# Patient Record
Sex: Female | Born: 1945 | Race: White | Hispanic: No | Marital: Married | Smoking: Never smoker
Health system: Southern US, Community
[De-identification: ages and names within clinical notes are randomized; demographics above are authoritative.]

## PROBLEM LIST (undated history)

## (undated) DIAGNOSIS — I10 Essential (primary) hypertension: Secondary | ICD-10-CM

## (undated) DIAGNOSIS — E785 Hyperlipidemia, unspecified: Secondary | ICD-10-CM

## (undated) DIAGNOSIS — R011 Cardiac murmur, unspecified: Secondary | ICD-10-CM

## (undated) HISTORY — PX: TONSILLECTOMY: SUR1361

## (undated) HISTORY — DX: Hyperlipidemia, unspecified: E78.5

## (undated) HISTORY — DX: Cardiac murmur, unspecified: R01.1

## (undated) HISTORY — DX: Essential (primary) hypertension: I10

---

## 1999-01-05 ENCOUNTER — Ambulatory Visit (HOSPITAL_COMMUNITY): Admission: RE | Admit: 1999-01-05 | Discharge: 1999-01-05 | Payer: Self-pay | Admitting: Neurological Surgery

## 1999-01-05 ENCOUNTER — Encounter: Payer: Self-pay | Admitting: Neurological Surgery

## 1999-06-11 ENCOUNTER — Inpatient Hospital Stay (HOSPITAL_COMMUNITY): Admission: EM | Admit: 1999-06-11 | Discharge: 1999-06-13 | Payer: Self-pay | Admitting: Emergency Medicine

## 1999-06-11 ENCOUNTER — Encounter: Payer: Self-pay | Admitting: Surgery

## 1999-06-11 ENCOUNTER — Encounter: Payer: Self-pay | Admitting: *Deleted

## 1999-07-11 ENCOUNTER — Other Ambulatory Visit: Admission: RE | Admit: 1999-07-11 | Discharge: 1999-07-11 | Payer: Self-pay | Admitting: Obstetrics & Gynecology

## 2000-11-03 ENCOUNTER — Other Ambulatory Visit: Admission: RE | Admit: 2000-11-03 | Discharge: 2000-11-03 | Payer: Self-pay | Admitting: Obstetrics & Gynecology

## 2001-11-24 ENCOUNTER — Other Ambulatory Visit: Admission: RE | Admit: 2001-11-24 | Discharge: 2001-11-24 | Payer: Self-pay | Admitting: Obstetrics & Gynecology

## 2002-12-30 ENCOUNTER — Other Ambulatory Visit: Admission: RE | Admit: 2002-12-30 | Discharge: 2002-12-30 | Payer: Self-pay | Admitting: Obstetrics and Gynecology

## 2004-02-15 ENCOUNTER — Other Ambulatory Visit: Admission: RE | Admit: 2004-02-15 | Discharge: 2004-02-15 | Payer: Self-pay | Admitting: Obstetrics & Gynecology

## 2004-09-04 ENCOUNTER — Ambulatory Visit: Payer: Self-pay | Admitting: Cardiology

## 2004-12-25 ENCOUNTER — Ambulatory Visit: Payer: Self-pay | Admitting: *Deleted

## 2005-04-05 ENCOUNTER — Ambulatory Visit: Payer: Self-pay | Admitting: Cardiology

## 2005-04-15 ENCOUNTER — Other Ambulatory Visit: Admission: RE | Admit: 2005-04-15 | Discharge: 2005-04-15 | Payer: Self-pay | Admitting: Obstetrics & Gynecology

## 2005-04-22 ENCOUNTER — Ambulatory Visit: Payer: Self-pay | Admitting: *Deleted

## 2005-04-25 ENCOUNTER — Ambulatory Visit: Payer: Self-pay | Admitting: Cardiology

## 2005-08-12 ENCOUNTER — Ambulatory Visit: Payer: Self-pay | Admitting: *Deleted

## 2005-08-13 ENCOUNTER — Ambulatory Visit: Payer: Self-pay | Admitting: Cardiovascular Disease

## 2006-05-20 ENCOUNTER — Ambulatory Visit: Payer: Self-pay | Admitting: *Deleted

## 2006-06-04 ENCOUNTER — Ambulatory Visit: Payer: Self-pay

## 2006-06-04 ENCOUNTER — Ambulatory Visit: Payer: Self-pay | Admitting: *Deleted

## 2007-08-03 ENCOUNTER — Ambulatory Visit: Payer: Self-pay | Admitting: Cardiology

## 2007-08-03 LAB — CONVERTED CEMR LAB
Albumin: 3.7 g/dL (ref 3.5–5.2)
Alkaline Phosphatase: 46 units/L (ref 39–117)
Direct LDL: 150.8 mg/dL
HDL: 54.6 mg/dL (ref >39.0)
Total Bilirubin: 0.6 mg/dL (ref 0.3–1.2)
Total CHOL/HDL Ratio: 4
Total Protein: 6.6 g/dL (ref 6.0–8.3)
Triglycerides: 158 mg/dL — ABNORMAL HIGH (ref 0–149)

## 2007-08-04 ENCOUNTER — Ambulatory Visit: Payer: Self-pay | Admitting: Cardiology

## 2007-08-31 ENCOUNTER — Ambulatory Visit: Payer: Self-pay

## 2008-08-01 ENCOUNTER — Ambulatory Visit: Payer: Self-pay | Admitting: Cardiology

## 2008-08-01 LAB — CONVERTED CEMR LAB
ALT: 17 units/L (ref 0–35)
AST: 16 units/L (ref 0–37)
Albumin: 3.8 g/dL (ref 3.5–5.2)
Alkaline Phosphatase: 40 units/L (ref 39–117)
BUN: 8 mg/dL (ref 6–23)
Bilirubin, Direct: 0.1 mg/dL (ref 0.0–0.3)
CO2: 28 meq/L (ref 19–32)
Calcium: 8.8 mg/dL (ref 8.4–10.5)
Chloride: 103 meq/L (ref 96–112)
Cholesterol: 234 mg/dL (ref 0–200)
Creatinine, Ser: 0.7 mg/dL (ref 0.4–1.2)
Direct LDL: 164 mg/dL
GFR calc Af Amer: 109 mL/min
GFR calc non Af Amer: 90 mL/min
Glucose, Bld: 96 mg/dL (ref 70–99)
HDL: 62.9 mg/dL (ref >39.0)
Potassium: 3.6 meq/L (ref 3.5–5.1)
Sodium: 140 meq/L (ref 135–145)
Total Bilirubin: 0.6 mg/dL (ref 0.3–1.2)
Total CHOL/HDL Ratio: 3.7
Total Protein: 6.6 g/dL (ref 6.0–8.3)
Triglycerides: 177 mg/dL — ABNORMAL HIGH (ref 0–149)
VLDL: 35 mg/dL (ref 0–40)

## 2008-09-02 ENCOUNTER — Encounter: Admission: RE | Admit: 2008-09-02 | Discharge: 2008-09-02 | Payer: Self-pay | Admitting: Obstetrics & Gynecology

## 2008-11-29 ENCOUNTER — Encounter: Admission: RE | Admit: 2008-11-29 | Discharge: 2008-11-29 | Payer: Self-pay | Admitting: Neurological Surgery

## 2009-07-09 DIAGNOSIS — I10 Essential (primary) hypertension: Secondary | ICD-10-CM | POA: Insufficient documentation

## 2009-07-09 DIAGNOSIS — E785 Hyperlipidemia, unspecified: Secondary | ICD-10-CM

## 2009-07-14 ENCOUNTER — Ambulatory Visit: Payer: Self-pay | Admitting: Cardiology

## 2009-07-20 ENCOUNTER — Ambulatory Visit: Payer: Self-pay | Admitting: Cardiology

## 2009-07-21 LAB — CONVERTED CEMR LAB
ALT: 14 units/L (ref 0–35)
AST: 21 units/L (ref 0–37)
Albumin: 4 g/dL (ref 3.5–5.2)
Alkaline Phosphatase: 37 units/L — ABNORMAL LOW (ref 39–117)
Calcium: 8.9 mg/dL (ref 8.4–10.5)
Cholesterol: 188 mg/dL (ref 0–200)
Creatinine, Ser: 0.7 mg/dL (ref 0.4–1.2)
GFR calc non Af Amer: 89.51 mL/min (ref >60)
HDL: 57.9 mg/dL (ref >39.00)
Sodium: 136 meq/L (ref 135–145)
Total CHOL/HDL Ratio: 3
Triglycerides: 161 mg/dL — ABNORMAL HIGH (ref 0.0–149.0)

## 2009-09-06 ENCOUNTER — Encounter: Admission: RE | Admit: 2009-09-06 | Discharge: 2009-09-06 | Payer: Self-pay | Admitting: Obstetrics & Gynecology

## 2010-08-13 ENCOUNTER — Telehealth: Payer: Self-pay | Admitting: Cardiology

## 2010-09-06 ENCOUNTER — Encounter: Payer: Self-pay | Admitting: Cardiology

## 2010-09-06 ENCOUNTER — Ambulatory Visit: Payer: Self-pay | Admitting: Cardiology

## 2010-09-10 ENCOUNTER — Ambulatory Visit: Payer: Self-pay | Admitting: Cardiology

## 2010-09-13 LAB — CONVERTED CEMR LAB
ALT: 13 units/L (ref 0–35)
AST: 20 units/L (ref 0–37)
Albumin: 4 g/dL (ref 3.5–5.2)
Alkaline Phosphatase: 42 units/L (ref 39–117)
CO2: 27 meq/L (ref 19–32)
Calcium: 9.4 mg/dL (ref 8.4–10.5)
Creatinine, Ser: 0.6 mg/dL (ref 0.4–1.2)
GFR calc non Af Amer: 100.72 mL/min (ref >60)
Sodium: 138 meq/L (ref 135–145)
Total Bilirubin: 0.4 mg/dL (ref 0.3–1.2)
Total CHOL/HDL Ratio: 3
Triglycerides: 141 mg/dL (ref 0.0–149.0)

## 2010-09-14 ENCOUNTER — Telehealth: Payer: Self-pay | Admitting: Cardiology

## 2010-12-04 NOTE — Progress Notes (Signed)
Summary: rx sent o [address] today   Phone Note Refill Request Call back at Home Phone [phone] Message from:  Patient on [facility] [place]   [phone]  Refills Requested: Medication #1:  LISINOPRIL 20 MG TABS 1 tab once daily Initial call taken by: Omer Jack,  August 13, 2010 9:54 AM    Prescriptions: LISINOPRIL 20 MG TABS (LISINOPRIL) 1 tab once daily  #30 x 11   Entered by:   Danielle Rankin, CMA   Authorized by:   Gaylord Shih, MD, [facility]   Signed by:   Danielle Rankin, CMA on 08/13/2010   Method used:   Electronically to        [facility]  [address] (retail)       [address]       [place], [place]  [address]       Ph: [phone]       Fax: [phone]   RxID:   [id number]

## 2010-12-04 NOTE — Progress Notes (Signed)
Summary: TEST RESULTS   Phone Note Call from Patient Call back at [facility] Phone [phone]   Caller: Patient Summary of Call: TEST RESULTS Initial call taken by: Judie Grieve,  September 14, 2010 3:52 PM  Follow-up for Phone Call        09/14/10--1640pm--test results given to pt--nt Follow-up by: Ledon Snare, RN,  September 14, 2010 4:40 PM     Appended Document: TEST RESULTS  Reviewed Juanito Doom, MD

## 2010-12-04 NOTE — Assessment & Plan Note (Signed)
Summary: yearly f/u /cy  Medications Added LOVASTATIN 10 MG TABS (LOVASTATIN) two tablets twice daily        Visit Type:  Follow-up Primary Provider:  Dr. Sherwood Gambler  CC:  no complaints.  History of Present Illness: Heather Miles comes in today for management of her hypertension, hyperlipidemia, and history of minimal mitral regurgitation.  She's doing remarkably well with no dyspnea on exertion, and exertional chest discomfort. She denies the palpitations. She has had no orthopnea, PND or edema. She denies syncope.  She's overdue blood work. She does establish with Dr.Fusco as her primary care physician. She says she rather get her blood work done here.  Current Medications (verified): 1)  Lovastatin 10 Mg Tabs (Lovastatin) .... Two Tablets Twice Daily 2)  Aspirin 81 Mg Tbec (Aspirin) .... Take One Tablet By Mouth Daily 3)  Lisinopril 20 Mg Tabs (Lisinopril) .Marland Kitchen.. 1 Tab Once Daily 4)  Multivitamins   Tabs (Multiple Vitamin) .Marland Kitchen.. 1 Tab Once Daily 5)  Caltrate 600+d 600-400 Mg-Unit Tabs (Calcium Carbonate-Vitamin D) .Marland Kitchen.. 1 Tab Once Daily 6)  Estradiol 1 Mg Tabs (Estradiol) .Marland Kitchen.. 1 Tab Once Daily 7)  Fish Oil 1000 Mg Caps (Omega-3 Fatty Acids) .Marland Kitchen.. 1 Cap Once Daily 8)  Zyrtec Allergy 10 Mg Caps (Cetirizine Hcl) .Marland Kitchen.. 1 Cap Once Daily 9)  Zantac 150 Mg Tabs (Ranitidine Hcl) .Marland Kitchen.. 1 Tab Once Daily 10)  Vitamin D 1000 Unit Tabs (Cholecalciferol) .Marland Kitchen.. 1 Tab Once Daily 11)  Relafen .... Use As Directed  Allergies: 1)  ! Iodine 2)  ! 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   Review of Systems     negative other than history of present illness  Vital Signs:  Patient profile:   65 year old female Height:      62 inches Weight:      145 pounds BMI:     26.62 Pulse rate:   100 / minute Pulse rhythm:   regular BP sitting:   102 / 66  (left arm)  Vitals Entered By: Jacquelin Hawking, CMA (September 06, 2010 2:01 PM)  Physical Exam  General:  Well developed, well nourished, in no acute distress. Head:  normocephalic and atraumatic Eyes:  PERRLA/EOM intact; conjunctiva and lids normal. Neck:  Neck supple, no JVD. No masses, thyromegaly or abnormal cervical nodes. Chest Wall:  no deformities or breast masses noted Lungs:  Clear bilaterally to auscultation and percussion. Heart:  PMI nondisplaced, normal S1-S2, no click or significant murmur. Carotid upstrokes equal bilaterally without bruits. Regular rate and rhythm Abdomen:  off, good bowel sounds, no bruit Msk:  Back normal, normal gait. Muscle strength and tone normal. Pulses:  pulses normal in all 4 extremities Extremities:  No clubbing or cyanosis. Neurologic:  Alert and oriented x 3. Skin:  Intact without lesions or rashes. Psych:  Normal affect.   Impression & Recommendations:  Problem # 1:  MITRAL REGURGITATION/ TIRVIAL (ICD-396.3) Assessment Unchanged  Orders: EKG w/ Interpretation (93000)  Problem # 2:  HYPERLIPIDEMIA-MIXED (ICD-272.4) overdue for surveillance check, scheduled fasting lipids and comprehensive metabolic panel. Her updated medication list for this problem includes:    Lovastatin 10 Mg Tabs (Lovastatin) .Marland Kitchen..Marland Kitchen Two tablets twice daily  Problem #  3:  HYPERTENSION, UNSPECIFIED (ICD-401.9) Assessment: Improved  Her updated medication list for this problem includes:    Aspirin 81 Mg Tbec (Aspirin) .Marland Kitchen... Take one tablet by mouth daily    Lisinopril 20 Mg Tabs (Lisinopril) .Marland Kitchen... 1 tab once daily  Patient Instructions: 1)  Your physician recommends that you schedule a follow-up appointment in: 1 year  with Dr. Daleen Squibb 2)  Your physician recommends that you return for a FASTING lipid profile,liver, bmp 401.9, 272.4 3)  Your physician recommends that you continue on your current medications as directed. Please refer to the Current Medication list given to you today.  Appended Document: yearly f/u /cy    Clinical Lists Changes  Medications: Rx of LISINOPRIL 20 MG TABS (LISINOPRIL) 1 tab once daily;  #30 x 11;  Signed;  Entered by: Lisabeth Devoid RN;  Authorized by: Gaylord Shih, MD, [facility];  Method used: Electronically to [facility]  [phone]*, [address] [place], [place], [place]  [address], Ph: [phone] or [phone], Fax: [phone] Rx of LOVASTATIN 10 MG TABS (LOVASTATIN) two tablets twice daily;  #120 x 11;  Signed;  Entered by: Lisabeth Devoid RN;  Authorized by: Gaylord Shih, MD, [facility];  Method used: Electronically to [facility]  [phone]*, [address] [place], [place], [place]  [address], Ph: [phone] or [phone], Fax: [phone]    Prescriptions: LOVASTATIN 10 MG TABS (LOVASTATIN) two tablets twice daily  #120 x 11   Entered by:   Lisabeth Devoid RN   Authorized by:   Gaylord Shih, MD, [facility]   Signed by:   Lisabeth Devoid RN on 09/06/2010   Method used:   Electronically to        [facility]  [phone]* (retail)       [address]       [place], [place]  [address]       Ph: [phone] or [phone]       Fax: [phone]   RxID:   [id number] LISINOPRIL 20 MG TABS (LISINOPRIL) 1 tab once daily  #30 x 11   Entered by:   Lisabeth Devoid RN   Authorized by:   Gaylord Shih, MD, [facility]   Signed by:   Lisabeth Devoid RN on 09/06/2010   Method used:   Electronically to        [facility]  [phone]* (retail)       [address]       [place], [place]  [address]       Ph: [phone] or [phone]       Fax: [phone]   RxID:   [id number]

## 2011-03-19 NOTE — Assessment & Plan Note (Signed)
[facility]                            CARDIOLOGY OFFICE NOTE   NAME:Jonas, Sharrie J                         MRN:          [id number]  DATE:08/04/2007                            DOB:          03/30/1946    HISTORY:  Ms. Johannsen comes to me today to establish with me as her  cardiologist.  She has been a former patient of Dr. Celso Sickle [facility]'s.   PROBLEMS:  1. 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  Her      last echocardiogram was in 2002, which showed no regurgitation.  2. Hypertension, under good control.  3. Mixed hyperlipidemia with intolerance to multiple statins.  She is      on Lovastatin 80 mg daily, with recent lipids showing a drop from      baseline of 275 to 219, triglycerides 158, HDL 54.6, LDL direct      409.8.  This is pretty good for her.   She has no complaints today.  She denies shortness of breath,  palpitations, chest discomfort, orthopnea, PND or peripheral edema.   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   CURRENT MEDICATIONS:  1. Lisinopril 20 mg daily.  2. Lovastatin 80 mg daily.  3. Multivitamin.  4. Caltrate.  5. Vitamin D.  6. Fish oil 1000 mg twice daily.  7. Estradiol 1 mg daily.  8. Claritin daily.  9. Aspirin 81 mg daily.  10.Amoxicillin for SBE prophylaxis, which I told her she did not need      to do any more.   PHYSICAL EXAMINATION:  VITAL SIGNS:  Blood pressure 134/78, pulse 78 and  regular, weight 148 pounds.  GENERAL:  She looks much younger than stated age.  HEENT:  Normocephalic and atraumatic.  Pupils equal, round, reactive to  light and accommodation.  Extraocular movements intact.  Sclerae clear.  Facial symmetry is normal.  Dentition satisfactory.  NECK:  Supple.  Carotid upstrokes equal bilaterally without bruits.  There is a cervical diskectomy scar in the anterior left neck.  LUNGS:  Clear.  HEART:  A regular rate and  rhythm.  There is a soft systolic murmur  along the left sternal border.  I did not hear a definite click.  ABDOMEN:  Soft, with good bowel sounds.  No midline bruit.  EXTREMITIES:  No clubbing, cyanosis or edema.  Pulses intact.  NEUROLOGIC:  Intact.   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   I am pleased with how Ms. Nembhard is doing.  I have cleared her for some  minor surgery and also told her not to use SBE prophylaxis in the  future.  We will get a 2-D echocardiogram to assess any degree of mitral  regurgitation she might have.   FOLLOWUP:  I will see her back in one year otherwise.     Thomas C. Daleen Squibb, MD, [facility]  Electronically Signed    TCW/MedQ  DD: 08/04/2007  DT: 08/04/2007  Job #: [id number]

## 2011-03-19 NOTE — Assessment & Plan Note (Signed)
[facility]                            CARDIOLOGY OFFICE NOTE   NAME:Heather Miles                         MRN:          [id number]  DATE:08/01/2008                            DOB:          1946-11-03    Heather Miles returns today for further management of her history of  possible mitral valve prolapse.  Her last echocardiogram in 2008 did not  show prolapse.  The one 2002 did not show prolapse.  She had trivial  mitral regurgitation.  She still believes she has prolapse because she  was told by doctor in the past.   She also has hypertension, which has been under good control.  She has  mixed hyperlipidemia with intolerance to multiple STATINS.  She is on  lovastatin now 40 mg p.o. b.i.d.   She takes:  1. Aspirin 81 mg a day.  2. Lisinopril 20 mg a day.  3. Multivitamin.  4. Caltrate and D.  5. Estradiol 1 mg a day.  6. Fish oil 1000 mg daily.  7. Zyrtec 10 mg a day.  8. Generic Prilosec.   PHYSICAL EXAMINATION:  VITAL SIGNS:  Her blood pressure is 128/84.  Her  pulse is 75 and regular.  Her weight is 144, down 4.  HEENT:  Normal.  NECK:  Carotid upstrokes are equal bilateral without bruits.  No JVD.  Thyroid is not enlarged.  Trachea is midline.  LUNGS:  Clear to auscultation and percussion.  HEART:  Normal S1 and S2.  No click.  No gallop.  No murmur.  ABDOMEN:  Soft.  Good bowel sounds.  EXTREMITIES:  No cyanosis, clubbing, or edema.  Pulses are intact.  NEUROLOGIC:  Intact.  SKIN:  Unremarkable.   EKG shows sinus rhythm with nonspecific ST-segment changes.  She has an  RSR prime in V1 and V2, which is unchanged.   Heather Miles is doing well.  She liked me to check lipids and LFTs.   I will plan on seeing her back in a year.    Thomas C. Daleen Squibb, MD, [facility]  Electronically Signed   TCW/MedQ  DD: 08/01/2008  DT: 08/02/2008  Job #: [id number]

## 2011-03-22 NOTE — Assessment & Plan Note (Signed)
[facility]                              CARDIOLOGY OFFICE NOTE   NAME:Miles, [name]                         MRN:          [id number]  DATE:05/20/2006                            DOB:          05/29/1946    HISTORY OF PRESENT ILLNESS:  The patient is a pleasant 65 year old white  married female, [employer] employee, with following  history since 1981 with mitral valve prolapse, hypertension, and  hyperlipidemia.  She had normal coronary angiography in 1986.  She has not  been seen here since September 13, 2003, although she has been seen in the  lipid clinic.  The patient has no cardiac symptoms.   MEDICATIONS:  Her medications include:  1.  Esterase.  2.  Lovastatin 60 mg a day.  3.  Caltrate.  4.  Fish oil.  5.  Altace 5 b.i.d.  6.  Estradiol.  7.  Claritin.   LABORATORY DATA:  Most recent LDL in February was 95, HDL 63, triglycerides  131.   PHYSICAL EXAMINATION:  VITAL SIGNS:  Blood pressure 134/84, pulse 86, normal  sinus rhythm.  Temperature normal.  NECK:  JVP is elevated.  Carotid pulses equal with short bruit over the left  carotid.  LUNGS:  Clear.  CARDIAC:  No murmur or click.  ABDOMEN:  There is some fullness in the epigastrium but no pulsations.  EXTREMITIES:  Reveal no edema.  Pulses palpable bilaterally.   LABORATORY DATA:  EKG normal except for right bundle branch block.   IMPRESSION:  1.  Hypertension, fairly well-controlled.  2.  Hyperlipidemia, fair control.  3.  History of mild mitral valve prolapse.  4.  Left carotid bruit.   I have suggested follow-up lipid, LFTs, carotid Dopplers, and an ultrasound.  The plan is to see the patient back in 3-4 months or p.r.n.  She is to  continue      lipid clinic.  I would like to see her LDL go below 70.  She states that      she has been able to take Vytorin in the past.                              E. Graceann Congress, MD, [facility]    EJL/MedQ  DD:   05/20/2006  DT:  05/20/2006  Job #:  [id number]

## 2011-07-23 ENCOUNTER — Telehealth: Payer: Self-pay | Admitting: Cardiology

## 2011-07-23 NOTE — Telephone Encounter (Signed)
Lovastatin 20 mg, uses [facility] [address], pt requesting tomorrow

## 2011-07-24 ENCOUNTER — Other Ambulatory Visit: Payer: Self-pay

## 2011-07-24 MED ORDER — LOVASTATIN 20 MG PO TABS: ORAL_TABLET | ORAL | Status: DC

## 2011-07-25 ENCOUNTER — Telehealth: Payer: Self-pay | Admitting: Cardiology

## 2011-07-25 NOTE — Telephone Encounter (Signed)
Pt said she wants lovastatin called [facility] [address] next wed 0926 she is going out of town

## 2011-07-26 MED ORDER — LOVASTATIN 20 MG PO TABS: ORAL_TABLET | ORAL | Status: DC

## 2011-08-27 ENCOUNTER — Other Ambulatory Visit: Payer: Self-pay | Admitting: Cardiology

## 2011-08-27 NOTE — Telephone Encounter (Signed)
Pt calling to check on status of refill request. Pt will be out of medicine this week. Please call this rx in asap.

## 2011-09-25 ENCOUNTER — Encounter: Payer: Self-pay | Admitting: *Deleted

## 2011-10-01 ENCOUNTER — Encounter: Payer: Self-pay | Admitting: Cardiology

## 2011-10-01 ENCOUNTER — Ambulatory Visit (INDEPENDENT_AMBULATORY_CARE_PROVIDER_SITE_OTHER): Payer: Medicare Other | Admitting: Cardiology

## 2011-10-01 VITALS — BP 124/74 | HR 79 | Ht 62.0 in | Wt 145.0 lb

## 2011-10-01 DIAGNOSIS — E785 Hyperlipidemia, unspecified: Secondary | ICD-10-CM

## 2011-10-01 DIAGNOSIS — I10 Essential (primary) hypertension: Secondary | ICD-10-CM

## 2011-10-01 MED ORDER — LOVASTATIN 20 MG PO TABS: 80.0000 mg | ORAL_TABLET | Freq: Two times a day (BID) | ORAL | Status: DC

## 2011-10-01 MED ORDER — LISINOPRIL 20 MG PO TABS: 20.0000 mg | ORAL_TABLET | Freq: Every day | ORAL | Status: DC

## 2011-10-01 NOTE — Assessment & Plan Note (Signed)
Good control. No change in treatment. 

## 2011-10-01 NOTE — Patient Instructions (Signed)
Your physician recommends that you return for lab work  Fasting cholesterol and lipids  Your physician recommends that you continue on your current medications as directed. Please refer to the Current Medication list given to you today.  Your physician wants you to follow-up in: 1 year or as needed with Dr. Daleen Squibb. You will receive a reminder letter in the mail two months in advance. If you don't receive a letter, please call our office to schedule the follow-up appointment.

## 2011-10-01 NOTE — Progress Notes (Signed)
HPI Heather Miles returns today for evaluation and management of her history of hypertension and mixed hyperlipidemia.  She is compliant with her medications. She denies any chest pain, palpitations, orthopnea, PND, presyncope or syncope.  She feels like the new generic tablet for lovastatin gives her some palpitations. They're not sustained. They're intermittent and unpredictable.  She would like to have her blood work drawn here rather than primary care.  She remains very active and just returned from a trip from [place]. She is compliant with her medications.  The last echocardiogram we can find 2008. There was no sign of prolapse and there was only trivial mitral and tricuspid regurgitation.    Past Medical History  Diagnosis Date  . Other and unspecified hyperlipidemia   . Unspecified essential hypertension   . Mitral valve insufficiency and aortic valve insufficiency     Current Outpatient Prescriptions  Medication Sig Dispense Refill  . aspirin 81 MG tablet Take 81 mg by mouth daily.        Marland Kitchen azelastine (ASTELIN) 137 MCG/SPRAY nasal spray Place 1 spray into the nose as needed. Use in each nostril as directed       . Calcium Carbonate-Vit D-Min (CALTRATE 600+D PLUS) 600-400 MG-UNIT per tablet Take 1 tablet by mouth daily.        . cetirizine (ZYRTEC) 10 MG tablet Take 10 mg by mouth daily.        . cholecalciferol (VITAMIN D) 1000 UNITS tablet Take 1,000 Units by mouth daily.        . dorzolamide (TRUSOPT) 2 % ophthalmic solution 3 drops daily as directed      . estradiol (ESTRACE) 1 MG tablet Take 1 mg by mouth daily.        . fish oil-omega-3 fatty acids 1000 MG capsule Take 1 g by mouth daily.        . fluticasone (VERAMYST) 27.5 MCG/SPRAY nasal spray Place 2 sprays into the nose as needed.        Marland Kitchen lisinopril (PRINIVIL,ZESTRIL) 20 MG tablet TAKE ONE TABLET BY MOUTH EVERY DAY  30 tablet  3  . lovastatin (MEVACOR) 20 MG tablet TAKE TWO TABLETS BY MOUTH TWICE DAILY  120 tablet  3   . multivitamin (THERAGRAN) per tablet Take 1 tablet by mouth daily.          Allergies  Allergen Reactions  . Iodine     No family history on file.  History   Social History  . Marital Status: Married    Spouse Name: N/A    Number of Children: N/A  . Years of Education: N/A   Occupational History  . retired Forensic scientist at [employer]   Social History Main Topics  . Smoking status: Never Smoker   . Smokeless tobacco: Not on file  . Alcohol Use: No  . Drug Use: No  . Sexually Active: Not on file   Other Topics Concern  . Not on file   Social History Narrative  . No narrative on file    ROS ALL NEGATIVE EXCEPT THOSE NOTED IN HPI  PE  General Appearance: well developed, well nourished in no acute distress HEENT: symmetrical face, PERRLA, good dentition  Neck: no JVD, thyromegaly, or adenopathy, trachea midline Chest: symmetric without deformity Cardiac: PMI non-displaced, RRR, normal S1, S2, no gallop,Soft 1/6 systolic murmur at the apex, no click Lung: clear to ausculation and percussion Vascular: all pulses full without bruits  Abdominal: nondistended, nontender, good  bowel sounds, no HSM, no bruits Extremities: no cyanosis, clubbing or edema, no sign of DVT, no varicosities  Skin: normal color, no rashes Neuro: alert and oriented x 3, non-focal Pysch: normal affect  EKG Normal sinus rhythm, incomplete right bundle branch block, no acute changes. BMET    Component Value Date/Time   NA 138 09/10/2010 0842   K 4.7 09/10/2010 0842   CL 103 09/10/2010 0842   CO2 27 09/10/2010 0842   GLUCOSE 101* 09/10/2010 0842   BUN 15 09/10/2010 0842   CREATININE 0.6 09/10/2010 0842   CALCIUM 9.4 09/10/2010 0842   GFRNONAA 100.72 09/10/2010 0842   GFRAA 109 08/01/2008 1014    Lipid Panel     Component Value Date/Time   CHOL 184 09/10/2010 0842   TRIG 141.0 09/10/2010 0842   HDL 61.80 09/10/2010 0842   CHOLHDL 3 09/10/2010 0842   VLDL 28.2 09/10/2010 0842    LDLCALC 94 09/10/2010 0842    CBC No results found for this basename: wbc, rbc, hgb, hct, plt, mcv, mch, mchc, rdw, neutrabs, lymphsabs, monoabs, eosabs, basosabs

## 2011-10-01 NOTE — Assessment & Plan Note (Signed)
Arrange  fasting lipids and LFTs per her request.

## 2011-10-04 ENCOUNTER — Other Ambulatory Visit (INDEPENDENT_AMBULATORY_CARE_PROVIDER_SITE_OTHER): Payer: Medicare Other | Admitting: *Deleted

## 2011-10-04 DIAGNOSIS — E785 Hyperlipidemia, unspecified: Secondary | ICD-10-CM

## 2011-10-04 LAB — LIPID PANEL
LDL Cholesterol: 97 mg/dL (ref 0–99)
VLDL: 36.2 mg/dL (ref 0.0–40.0)

## 2011-10-04 LAB — HEPATIC FUNCTION PANEL
Alkaline Phosphatase: 41 U/L (ref 39–117)
Bilirubin, Direct: 0 mg/dL (ref 0.0–0.3)
Total Bilirubin: 0.3 mg/dL (ref 0.3–1.2)

## 2011-10-09 ENCOUNTER — Other Ambulatory Visit: Payer: Self-pay | Admitting: *Deleted

## 2011-10-09 MED ORDER — LOVASTATIN 20 MG PO TABS: 40.0000 mg | ORAL_TABLET | Freq: Two times a day (BID) | ORAL | Status: DC

## 2011-10-09 NOTE — Telephone Encounter (Signed)
Called pt with lab results and instructed pt to reduce amount of carbohydrate intake.  Also, corrected pt's lovastatin Rx sig and resent to [facility].  Waymon Budge, LPN

## 2012-05-13 ENCOUNTER — Ambulatory Visit
Admission: RE | Admit: 2012-05-13 | Discharge: 2012-05-13 | Disposition: A | Discharge status: Home / Self Care | Payer: Medicare Other | Source: Ambulatory Visit | Attending: Allergy and Immunology | Admitting: Allergy and Immunology

## 2012-05-13 ENCOUNTER — Other Ambulatory Visit: Payer: Self-pay | Admitting: Allergy and Immunology

## 2012-05-13 DIAGNOSIS — R05 Cough: Secondary | ICD-10-CM

## 2012-09-24 ENCOUNTER — Other Ambulatory Visit: Payer: Self-pay | Admitting: *Deleted

## 2012-09-24 MED ORDER — LISINOPRIL 20 MG PO TABS: 20.0000 mg | ORAL_TABLET | Freq: Every day | ORAL | Status: DC

## 2012-11-19 ENCOUNTER — Ambulatory Visit (INDEPENDENT_AMBULATORY_CARE_PROVIDER_SITE_OTHER): Payer: Medicare Other | Admitting: Cardiology

## 2012-11-19 ENCOUNTER — Encounter: Payer: Self-pay | Admitting: Cardiology

## 2012-11-19 VITALS — BP 126/76 | HR 74 | Ht 62.0 in | Wt 144.0 lb

## 2012-11-19 DIAGNOSIS — E785 Hyperlipidemia, unspecified: Secondary | ICD-10-CM

## 2012-11-19 DIAGNOSIS — I10 Essential (primary) hypertension: Secondary | ICD-10-CM

## 2012-11-19 MED ORDER — LISINOPRIL 20 MG PO TABS: 20.0000 mg | ORAL_TABLET | Freq: Every day | ORAL | Status: DC

## 2012-11-19 NOTE — Patient Instructions (Addendum)
Stop your Lovastatin for 4-6 weeks. Call the office back and let us know if this has helped your muscle spasms  Your physician wants you to follow-up in: 1 year with Dr. Daleen Squibb. You will receive a reminder letter in the mail two months in advance. If you don't receive a letter, please call our office to schedule the follow-up appointment.

## 2012-11-19 NOTE — Progress Notes (Signed)
HPI Mrs. Perrell comes in today for evaluation and management of her history of hypertension and hyperlipidemia. He complains of diffuse muscle aches and recent memory loss. She thinks this is secondary to her statin. Lovastatin has been the only statin that she's been in take. She denies any ischemic symptoms or other issues. Her blood pressures been under good control.  Past Medical History  Diagnosis Date  . Other and unspecified hyperlipidemia   . Unspecified essential hypertension   . Mitral valve insufficiency and aortic valve insufficiency     Current Outpatient Prescriptions  Medication Sig Dispense Refill  . aspirin 81 MG tablet Take 81 mg by mouth daily.        Marland Kitchen azelastine (ASTELIN) 137 MCG/SPRAY nasal spray Place 1 spray into the nose as needed. Use in each nostril as directed       . Calcium Carbonate-Vit D-Min (CALTRATE 600+D PLUS) 600-400 MG-UNIT per tablet Take 1 tablet by mouth daily.        . cetirizine (ZYRTEC) 10 MG tablet Take 10 mg by mouth daily.        . cholecalciferol (VITAMIN D) 1000 UNITS tablet Take 1,000 Units by mouth daily.        . dorzolamide (TRUSOPT) 2 % ophthalmic solution 3 drops daily as directed      . estradiol (ESTRACE) 1 MG tablet Take 1 mg by mouth daily.        . fish oil-omega-3 fatty acids 1000 MG capsule Take 1 g by mouth daily.        . fluticasone (VERAMYST) 27.5 MCG/SPRAY nasal spray Place 2 sprays into the nose as needed.        Marland Kitchen lisinopril (PRINIVIL,ZESTRIL) 20 MG tablet Take 1 tablet (20 mg total) by mouth daily.  90 tablet  0  . lovastatin (MEVACOR) 20 MG tablet Take 2 tablets (40 mg total) by mouth 2 (two) times daily.  360 tablet  3  . multivitamin (THERAGRAN) per tablet Take 1 tablet by mouth daily.          Allergies  Allergen Reactions  . Iodine     No family history on file.  History   Social History  . Marital Status: Married    Spouse Name: N/A    Number of Children: N/A  . Years of Education: N/A   Occupational  History  . retired Forensic scientist at [employer]   Social History Main Topics  . Smoking status: Never Smoker   . Smokeless tobacco: Not on file  . Alcohol Use: No  . Drug Use: No  . Sexually Active: Not on file   Other Topics Concern  . Not on file   Social History Narrative  . No narrative on file    ROS ALL NEGATIVE EXCEPT THOSE NOTED IN HPI  PE  General Appearance: well developed, well nourished in no acute distress HEENT: symmetrical face, PERRLA, good dentition  Neck: no JVD, thyromegaly, or adenopathy, trachea midline Chest: symmetric without deformity Cardiac: PMI non-displaced, RRR, normal S1, S2, no gallop or murmur Lung: clear to ausculation and percussion Vascular: all pulses full without bruits  Abdominal: nondistended, nontender, good bowel sounds, no HSM, no bruits Extremities: no cyanosis, clubbing or edema, no sign of DVT, no varicosities  Skin: normal color, no rashes Neuro: alert and oriented x 3, non-focal Pysch: normal affect  EKG  BMET    Component Value Date/Time   NA 138 09/10/2010 0842   K  4.7 09/10/2010 0842   CL 103 09/10/2010 0842   CO2 27 09/10/2010 0842   GLUCOSE 101* 09/10/2010 0842   BUN 15 09/10/2010 0842   CREATININE 0.6 09/10/2010 0842   CALCIUM 9.4 09/10/2010 0842   GFRNONAA 100.72 09/10/2010 0842   GFRAA 109 08/01/2008 1014    Lipid Panel     Component Value Date/Time   CHOL 195 10/04/2011 0955   TRIG 181.0* 10/04/2011 0955   HDL 62.30 10/04/2011 0955   CHOLHDL 3 10/04/2011 0955   VLDL 36.2 10/04/2011 0955   LDLCALC 97 10/04/2011 0955    CBC No results found for this basename: wbc, rbc, hgb, hct, plt, mcv, mch, mchc, rdw, neutrabs, lymphsabs, monoabs, eosabs, basosabs

## 2012-11-19 NOTE — Assessment & Plan Note (Signed)
We'll discontinue her lovastatin and see if her symptoms resolve. If they do we will just not restart any other hyperlipidemic drugs. She'll follow a heart healthy diet, exercise, and keep a close watch and control her blood pressure. I will see her back when necessary.

## 2012-12-19 ENCOUNTER — Other Ambulatory Visit: Payer: Self-pay

## 2013-01-07 ENCOUNTER — Encounter: Payer: Self-pay | Admitting: Cardiology

## 2013-06-09 ENCOUNTER — Other Ambulatory Visit: Payer: Self-pay

## 2013-09-09 ENCOUNTER — Other Ambulatory Visit: Payer: Self-pay

## 2013-11-11 ENCOUNTER — Ambulatory Visit (INDEPENDENT_AMBULATORY_CARE_PROVIDER_SITE_OTHER): Payer: Medicare Other | Admitting: Cardiology

## 2013-11-11 ENCOUNTER — Encounter: Payer: Self-pay | Admitting: Cardiology

## 2013-11-11 VITALS — BP 140/86 | HR 70 | Ht 62.0 in | Wt 126.0 lb

## 2013-11-11 DIAGNOSIS — E785 Hyperlipidemia, unspecified: Secondary | ICD-10-CM

## 2013-11-11 DIAGNOSIS — I10 Essential (primary) hypertension: Secondary | ICD-10-CM

## 2013-11-11 NOTE — Progress Notes (Signed)
HPI The patient presents for follow up of HTN and hyperlipidemia.  She also had some very mild MR in the past.  She is active.  She has been limited by some knee pain however.  With her usual activities she denies any cardiovascular symptoms.  The patient denies any new symptoms such as chest discomfort, neck or arm discomfort. There has been no new shortness of breath, PND or orthopnea. There have been no reported palpitations, presyncope or syncope.    Allergies  Allergen Reactions  . Iodine   . Statins     Muscle soreness and loss of memory    Current Outpatient Prescriptions  Medication Sig Dispense Refill  . Calcium Carbonate-Vit D-Min (CALTRATE 600+D PLUS) 600-400 MG-UNIT per tablet Take 1 tablet by mouth daily.        . cholecalciferol (VITAMIN D) 1000 UNITS tablet Take 1,000 Units by mouth daily.        . dorzolamide (TRUSOPT) 2 % ophthalmic solution 3 drops daily as directed      . estradiol (ESTRACE) 1 MG tablet Take 1 mg by mouth daily.        . fexofenadine (ALLEGRA) 180 MG tablet Take 180 mg by mouth daily.      . fish oil-omega-3 fatty acids 1000 MG capsule Take 1 g by mouth daily.        Marland Kitchen. lisinopril (PRINIVIL,ZESTRIL) 20 MG tablet Take 1 tablet (20 mg total) by mouth daily.  90 tablet  3  . multivitamin (THERAGRAN) per tablet Take 1 tablet by mouth daily.        Marland Kitchen. triamcinolone (NASACORT ALLERGY 24HR) 55 MCG/ACT AERO nasal inhaler Place 2 sprays into the nose daily.       No current facility-administered medications for this visit.    Past Medical History  Diagnosis Date  . Other and unspecified hyperlipidemia   . Unspecified essential hypertension   . Mitral valve insufficiency and aortic valve insufficiency     Past Surgical History  Procedure Laterality Date  . Tonsillectomy      ROS:  As stated in the HPI and negative for all other systems.  PHYSICAL EXAM BP 140/86  Pulse 70  Ht 5\' 2"  (1.575 m)  Wt 126 lb (57.153 kg)  BMI 23.04 kg/m2 GENERAL:   Well appearing HEENT:  Pupils equal round and reactive, fundi not visualized, oral mucosa unremarkable NECK:  No jugular venous distention, waveform within normal limits, carotid upstroke brisk and symmetric, no bruits, no thyromegaly LYMPHATICS:  No cervical, inguinal adenopathy LUNGS:  Clear to auscultation bilaterally BACK:  No CVA tenderness CHEST:  Unremarkable HEART:  PMI not displaced or sustained,S1 and S2 within normal limits, no S3, no S4, no clicks, no rubs, very brief apical systolic murmur nonradiating, no murmurs ABD:  Flat, positive bowel sounds normal in frequency in pitch, no bruits, no rebound, no guarding, no midline pulsatile mass, no hepatomegaly, no splenomegaly EXT:  2 plus pulses throughout, no edema, no cyanosis no clubbing SKIN:  No rashes no nodules NEURO:  Cranial nerves II through XII grossly intact, motor grossly intact throughout [facility]:  Cognitively intact, oriented to person place and time   EKG: Sinus rhythm, rate 70, axis within normal limits, intervals within normal limits, no acute ST-T wave changes, RSR prime V1 and V2.  11/11/2013   ASSESSMENT AND PLAN   HTN:  The blood pressure is at target. No change in medications is indicated. We will continue with therapeutic lifestyle changes (TLC).  DYSLIPIDEMIA:  She has a low cardiovascular 10 year risk and so I agree to not have her on a statin but since it has been a while I would repeat her lipid profile. She does have a strong family history of early coronary disease.  MR:  This was mild in the past and no further testing is indicated.

## 2013-11-11 NOTE — Patient Instructions (Signed)
The current medical regimen is effective;  continue present plan and medications.  Please return fasting for a lipid profile.  Follow up in 1 year with Dr Antoine PocheHochrein.  You will receive a letter in the mail 2 months before you are due.  Please call us when you receive this letter to schedule your follow up appointment.

## 2013-11-18 ENCOUNTER — Other Ambulatory Visit (INDEPENDENT_AMBULATORY_CARE_PROVIDER_SITE_OTHER): Payer: Medicare Other

## 2013-11-18 DIAGNOSIS — I10 Essential (primary) hypertension: Secondary | ICD-10-CM

## 2013-11-18 DIAGNOSIS — E785 Hyperlipidemia, unspecified: Secondary | ICD-10-CM

## 2013-11-18 LAB — LIPID PANEL
CHOLESTEROL: 257 mg/dL — AB (ref 0–200)
HDL: 62.7 mg/dL (ref >39.00)
Total CHOL/HDL Ratio: 4
Triglycerides: 135 mg/dL (ref 0.0–149.0)
VLDL: 27 mg/dL (ref 0.0–40.0)

## 2013-11-18 LAB — LDL CHOLESTEROL, DIRECT: Direct LDL: 178.8 mg/dL

## 2013-11-22 ENCOUNTER — Encounter: Payer: Self-pay | Admitting: Cardiology

## 2013-12-06 ENCOUNTER — Other Ambulatory Visit: Payer: Self-pay | Admitting: *Deleted

## 2013-12-06 MED ORDER — LOVASTATIN 20 MG PO TABS: 20.0000 mg | ORAL_TABLET | Freq: Four times a day (QID) | ORAL | Status: DC

## 2013-12-23 ENCOUNTER — Encounter: Payer: Self-pay | Admitting: *Deleted

## 2013-12-23 NOTE — Telephone Encounter (Signed)
This encounter was created in error - please disregard.

## 2014-01-04 ENCOUNTER — Telehealth: Payer: Self-pay | Admitting: *Deleted

## 2014-01-04 MED ORDER — LOVASTATIN 40 MG PO TABS: 40.0000 mg | ORAL_TABLET | Freq: Two times a day (BID) | ORAL | Status: DC

## 2014-01-04 NOTE — Telephone Encounter (Signed)
Spoke with pt who is taking Lovastatin 40 mg BID.  New rx sent into pharmacy as requested.

## 2014-01-17 ENCOUNTER — Other Ambulatory Visit: Payer: Self-pay | Admitting: *Deleted

## 2014-01-17 MED ORDER — LISINOPRIL 20 MG PO TABS: 20.0000 mg | ORAL_TABLET | Freq: Every day | ORAL | Status: DC

## 2014-01-26 IMAGING — CT CT PARANASAL SINUSES LIMITED
1 series · 8 of 10 positions shown, 10 images · non-contrast
Comparison: None.

CLINICAL DATA: 65-year-old female with postnasal drainage sore
throat congestion recent antibiotic treatment.  Cough.

CT LIMITED SINUSES WITHOUT CONTRAST
TECHNIQUE: Multidetector CT images of the paranasal sinuses were
obtained in a single plane without contrast.

[Series 3: cor soft · axial · 0.35mm/px · z∈[+53,+123]mm · 8 of 10 slices shown, 10 images]
[im 2/10  brain]
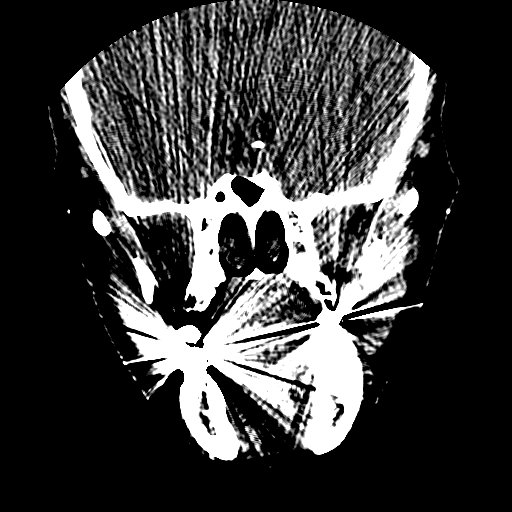
[im 2/10  bone]
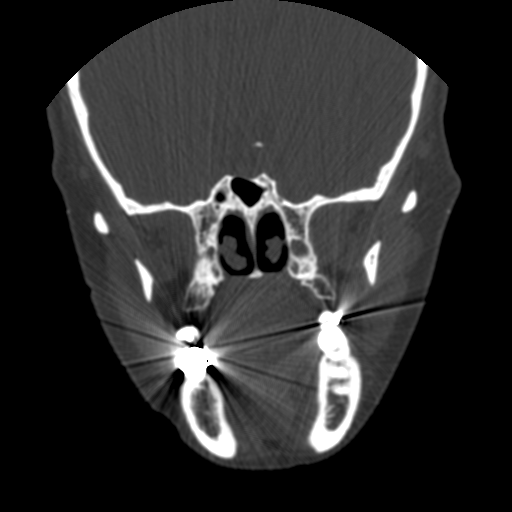
[im 3/10  bone]
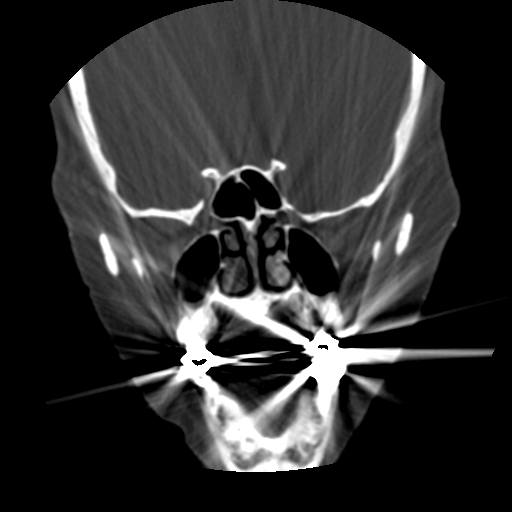
[im 4/10  bone]
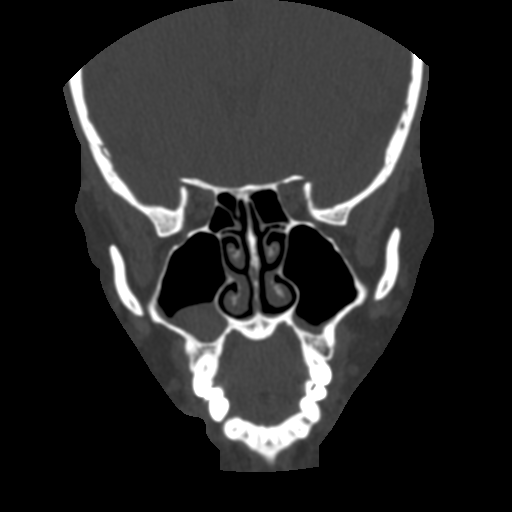
[im 5/10  bone]
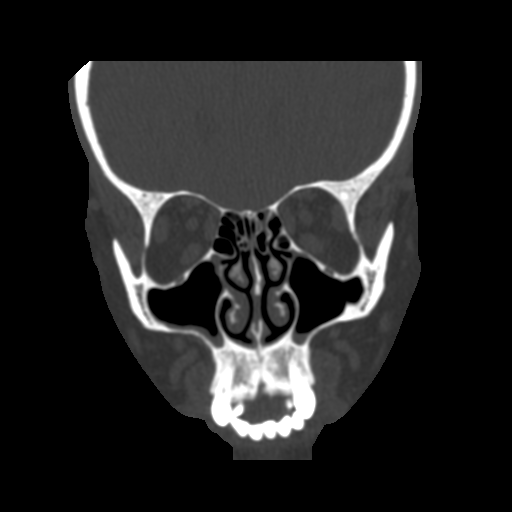
[im 6/10  brain]
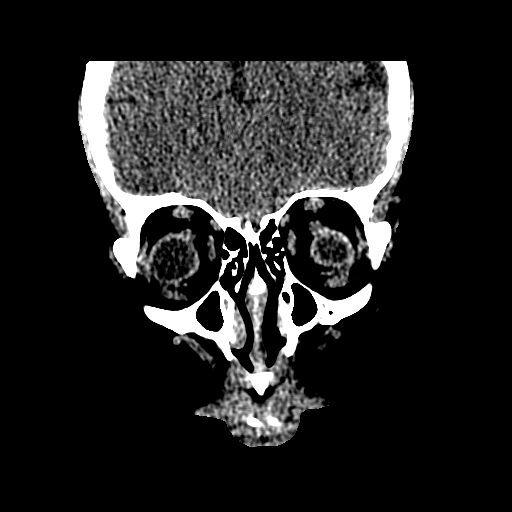
[im 6/10  bone]
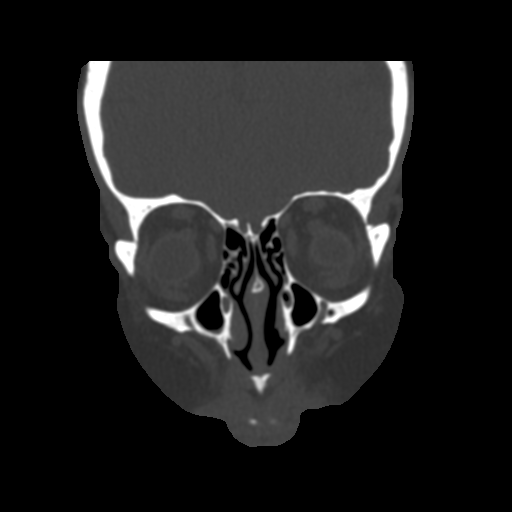
[im 7/10  bone]
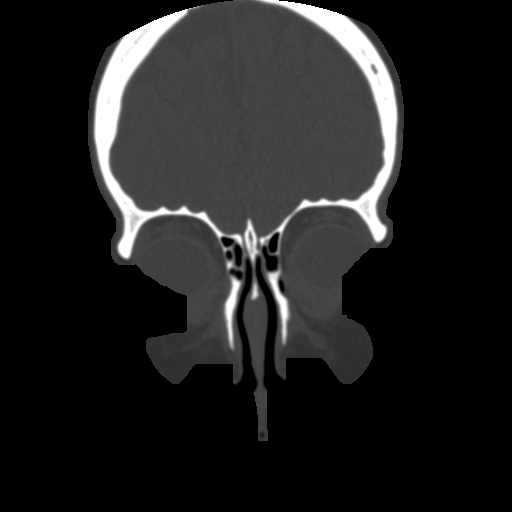
[im 8/10  bone]
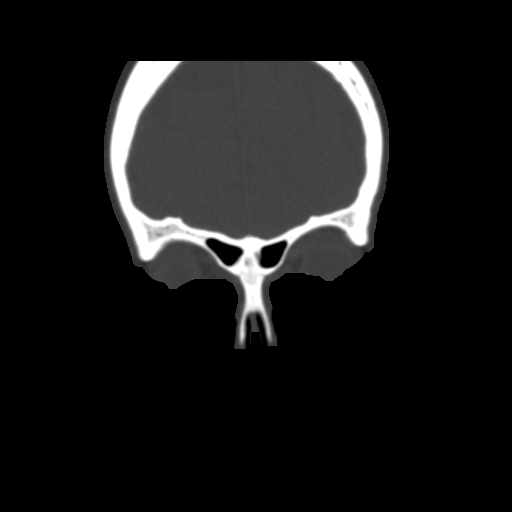
[im 9/10  bone]
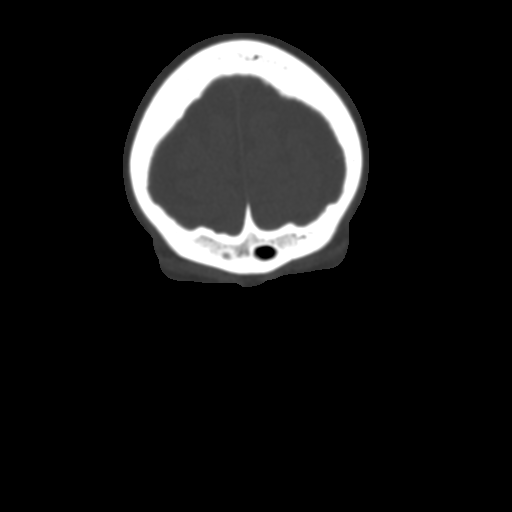

[8 of 10 positions shown; findings below may reference images not displayed]

FINDINGS: Grossly negative visualized noncontrast brain
parenchyma.  Grossly negative visualized orbit and face soft
tissues.

The sphenoid sinuses are clear.
There is minor scattered ethmoid air cell mucosal thickening.
The frontal sinuses are hypoplastic and clear.
There is mild predominantly dependent mucosal thickening in both
maxillary sinuses.  There is evidence of a small left maxillary
sinus mucous retention cyst.  Mucosal thickening does appear to
affect both OMCs.

Minor rightward nasal septal deviation. No acute osseous
abnormality identified.
IMPRESSION: Mild maxillary and ethmoid sinus mucosal thickening without CT
evidence of acute sinusitis.

## 2014-03-23 ENCOUNTER — Other Ambulatory Visit (HOSPITAL_COMMUNITY): Payer: Self-pay | Admitting: Internal Medicine

## 2014-03-23 DIAGNOSIS — Z139 Encounter for screening, unspecified: Secondary | ICD-10-CM

## 2014-03-23 DIAGNOSIS — Z0000 Encounter for general adult medical examination without abnormal findings: Secondary | ICD-10-CM

## 2014-03-31 ENCOUNTER — Ambulatory Visit (HOSPITAL_COMMUNITY): Payer: Medicare Other

## 2014-03-31 ENCOUNTER — Other Ambulatory Visit (HOSPITAL_COMMUNITY): Payer: Medicare Other

## 2014-08-19 ENCOUNTER — Other Ambulatory Visit: Payer: Self-pay

## 2014-12-08 ENCOUNTER — Encounter: Payer: Self-pay | Admitting: Cardiology

## 2014-12-08 ENCOUNTER — Ambulatory Visit (INDEPENDENT_AMBULATORY_CARE_PROVIDER_SITE_OTHER): Payer: Medicare Other | Admitting: Cardiology

## 2014-12-08 VITALS — BP 126/70 | HR 80 | Ht 62.0 in | Wt 131.0 lb

## 2014-12-08 DIAGNOSIS — R5382 Chronic fatigue, unspecified: Secondary | ICD-10-CM

## 2014-12-08 DIAGNOSIS — E785 Hyperlipidemia, unspecified: Secondary | ICD-10-CM

## 2014-12-08 NOTE — Patient Instructions (Signed)
Your physician recommends that you schedule a follow-up appointment in: one year with Dr. Antoine PocheHochrein  We are ordering a stress test for you to get done  And lab work for you to get done also

## 2014-12-08 NOTE — Progress Notes (Signed)
HPI The patient presents for follow up of HTN and hyperlipidemia.  She also had some very mild MR in the past but not on the most recent echo.  She is active.  Last October she was walking in [place] and had some nausea and could not go the entire way although she was able to walk greater than a mile up an incline.  She did not have chest pain.  She did not have excessive SOB.  She otherwise feels well.    With her usual activities she denies any cardiovascular symptoms.  The patient denies any new symptoms such as chest discomfort, neck or arm discomfort. There has been no new shortness of breath, PND or orthopnea. There have been no reported palpitations, presyncope or syncope.    Allergies  Allergen Reactions  . Iodine   . Statins     Muscle soreness and loss of memory    Current Outpatient Prescriptions  Medication Sig Dispense Refill  . azelastine (ASTELIN) 0.1 % nasal spray Place 1 spray into both nostrils at bedtime. Use in each nostril as directed    . Calcium Carbonate-Vit D-Min (CALTRATE 600+D PLUS) 600-400 MG-UNIT per tablet Take 1 tablet by mouth daily.      . cholecalciferol (VITAMIN D) 1000 UNITS tablet Take 1,000 Units by mouth daily.      . dorzolamide (TRUSOPT) 2 % ophthalmic solution 3 drops daily as directed    . estradiol (ESTRACE) 1 MG tablet Take 1 mg by mouth daily.      . fexofenadine (ALLEGRA) 180 MG tablet Take 180 mg by mouth daily.    . fish oil-omega-3 fatty acids 1000 MG capsule Take 1 g by mouth daily.      Marland Kitchen. latanoprost (XALATAN) 0.005 % ophthalmic solution Place 1 drop into the left eye daily.     Marland Kitchen. lisinopril (PRINIVIL,ZESTRIL) 20 MG tablet Take 1 tablet (20 mg total) by mouth daily. 90 tablet 3  . lovastatin (MEVACOR) 40 MG tablet Take 1 tablet (40 mg total) by mouth 2 (two) times daily. 180 tablet 3  . multivitamin (THERAGRAN) per tablet Take 1 tablet by mouth daily.       No current facility-administered medications for this visit.    Past Medical  History  Diagnosis Date  . Other and unspecified hyperlipidemia   . Unspecified essential hypertension   . Mitral valve insufficiency and aortic valve insufficiency     Past Surgical History  Procedure Laterality Date  . Tonsillectomy      ROS:  As stated in the HPI and negative for all other systems.  PHYSICAL EXAM BP 126/70 mmHg  Pulse 80  Ht 5\' 2"  (1.575 m)  Wt 131 lb (59.421 kg)  BMI 23.95 kg/m2 GENERAL:  Well appearing HEENT:  Pupils equal round and reactive, fundi not visualized, oral mucosa unremarkable NECK:  No jugular venous distention, waveform within normal limits, carotid upstroke brisk and symmetric, no bruits, no thyromegaly LYMPHATICS:  No cervical, inguinal adenopathy LUNGS:  Clear to auscultation bilaterally BACK:  No CVA tenderness CHEST:  Unremarkable HEART:  PMI not displaced or sustained,S1 and S2 within normal limits, no S3, no S4, no clicks, no rubs, very brief apical systolic murmur nonradiating, no murmurs ABD:  Flat, positive bowel sounds normal in frequency in pitch, no bruits, no rebound, no guarding, no midline pulsatile mass, no hepatomegaly, no splenomegaly EXT:  2 plus pulses throughout, no edema, no cyanosis no clubbing SKIN:  No rashes no nodules NEURO:  Cranial nerves II through XII grossly intact, motor grossly intact throughout PSYCH:  Cognitively intact, oriented to person place and time   ASSESSMENT AND PLAN  DECREASED EXERCISE TOLERANCE:  She had one noticed episode of this and does have a family history of early coronary disease.  I will bring the patient back for a POET (Plain Old Exercise Test). This will allow me to screen for obstructive coronary disease, risk stratify and very importantly provide a prescription for exercise.  HTN:  The blood pressure is at target. No change in medications is indicated. We will continue with therapeutic lifestyle changes (TLC).  DYSLIPIDEMIA:  I will follow up a lipid profile.    MR:  This was  mild in the past and no further testing is indicated.

## 2014-12-16 ENCOUNTER — Other Ambulatory Visit: Payer: Self-pay | Admitting: Cardiology

## 2014-12-16 MED ORDER — LISINOPRIL 20 MG PO TABS: 20.0000 mg | ORAL_TABLET | Freq: Every day | ORAL | Status: DC

## 2014-12-16 MED ORDER — LOVASTATIN 40 MG PO TABS: 40.0000 mg | ORAL_TABLET | Freq: Two times a day (BID) | ORAL | Status: DC

## 2014-12-16 NOTE — Telephone Encounter (Signed)
E-SENT MEDICATION 90 SUPPLY   PATIENT NOTFIED

## 2014-12-16 NOTE — Telephone Encounter (Signed)
°  1. Which medications need to be refilled? Lisinopril 20mg  one a day and lovastatin 40mg  twice a day   2. Which pharmacy is medication to be sent to?[facility] on [address]   3. Do they need a 30 day or 90 day supply? 90   4. Would they like a call back once the medication has been sent to the pharmacy? Yes

## 2014-12-29 ENCOUNTER — Telehealth (HOSPITAL_COMMUNITY): Payer: Self-pay

## 2014-12-29 NOTE — Telephone Encounter (Signed)
Encounter complete. 

## 2015-01-03 ENCOUNTER — Ambulatory Visit (HOSPITAL_COMMUNITY)
Admission: RE | Admit: 2015-01-03 | Discharge: 2015-01-03 | Disposition: A | Discharge status: Home / Self Care | Payer: Medicare Other | Source: Ambulatory Visit | Attending: Cardiology | Admitting: Cardiology

## 2015-01-03 DIAGNOSIS — R5382 Chronic fatigue, unspecified: Secondary | ICD-10-CM

## 2015-01-03 NOTE — Procedures (Signed)
Exercise Treadmill Test  Pre-Exercise Testing Evaluation  NSR  Test  Exercise Tolerance Test Ordering MD: Angelina SheriffJake Hochrein, MD    Unique Test No: 1  Treadmill:  1  Indication for ETT: Reduced Exercise Tolerance  Contraindication to ETT: No   Stress Modality: exercise - treadmill  Cardiac Imaging Performed: non   Protocol: standard Bruce - maximal  Max BP:  182/92  Max MPHR (bpm):  151 85% MPR (bpm):  128  MPHR obtained (bpm):  160 % MPHR obtained:  105  Reached 85% MPHR (min:sec):  1:22 Total Exercise Time (min-sec):  7  Workload in METS:  8.5 Borg Scale: 15  Reason ETT Terminated:  SOB and THR achieved    ST Segment Analysis At Rest: normal ST segments - no evidence of significant ST depression With Exercise: no evidence of significant ST depression  Other Information Arrhythmia:  No Angina during ETT:  absent (0) Quality of ETT:  diagnostic  ETT Interpretation:  normal - no evidence of ischemia by ST analysis  Comments: Good exercise tolerance. Mildly hypertensive response to exercise.  Heather FairMihai Dariela Stoker, MD, [facility] [phone] office [phone] pager

## 2015-01-04 LAB — LIPID PANEL
Cholesterol: 183 mg/dL (ref 0–200)
HDL: 72 mg/dL (ref >=46)
LDL CALC: 82 mg/dL (ref 0–99)
Total CHOL/HDL Ratio: 2.5 Ratio
Triglycerides: 146 mg/dL (ref <150)
VLDL: 29 mg/dL (ref 0–40)

## 2015-01-10 ENCOUNTER — Telehealth: Payer: Self-pay | Admitting: Cardiology

## 2015-01-10 NOTE — Telephone Encounter (Signed)
Discussed results w/ patient. She voiced understanding. 

## 2015-01-10 NOTE — Telephone Encounter (Signed)
Pt would like her stress test results from 01-03-15 and her lab results also.

## 2015-01-10 NOTE — Telephone Encounter (Signed)
Left message for patient to return a call to us.

## 2015-03-20 ENCOUNTER — Other Ambulatory Visit: Payer: Self-pay | Admitting: Obstetrics & Gynecology

## 2015-03-21 LAB — CYTOLOGY - PAP

## 2015-05-01 ENCOUNTER — Other Ambulatory Visit: Payer: Self-pay

## 2015-05-11 ENCOUNTER — Ambulatory Visit (INDEPENDENT_AMBULATORY_CARE_PROVIDER_SITE_OTHER): Payer: Medicare Other | Admitting: Otolaryngology

## 2015-05-11 DIAGNOSIS — K122 Cellulitis and abscess of mouth: Secondary | ICD-10-CM | POA: Diagnosis not present

## 2016-01-24 ENCOUNTER — Other Ambulatory Visit: Payer: Self-pay | Admitting: Cardiology

## 2016-01-24 NOTE — Telephone Encounter (Signed)
Rx request sent to pharmacy.  

## 2016-02-01 ENCOUNTER — Encounter: Payer: Self-pay | Admitting: Cardiology

## 2016-02-01 ENCOUNTER — Ambulatory Visit (INDEPENDENT_AMBULATORY_CARE_PROVIDER_SITE_OTHER): Payer: Medicare Other | Admitting: Cardiology

## 2016-02-01 VITALS — BP 128/68 | HR 74 | Ht 62.0 in | Wt 133.5 lb

## 2016-02-01 DIAGNOSIS — Z79899 Other long term (current) drug therapy: Secondary | ICD-10-CM

## 2016-02-01 DIAGNOSIS — E785 Hyperlipidemia, unspecified: Secondary | ICD-10-CM | POA: Diagnosis not present

## 2016-02-01 DIAGNOSIS — R011 Cardiac murmur, unspecified: Secondary | ICD-10-CM | POA: Diagnosis not present

## 2016-02-01 NOTE — Progress Notes (Signed)
HPI The patient presents for follow up of HTN and hyperlipidemia.  She also had some trivial MR in the past.  She does have a murmur of her aortic outflow tract.  She is active although she's not exercising routinely.  She did not have chest pain.  She did not have excessive SOB.  She otherwise feels well.    With her usual activities she denies any cardiovascular symptoms.  The patient denies any new symptoms such as chest discomfort, neck or arm discomfort. There has been no new shortness of breath, PND or orthopnea. There have been no reported palpitations, presyncope or syncope.    Allergies  Allergen Reactions  . Iodine   . Statins     Muscle soreness and loss of memory    Current Outpatient Prescriptions  Medication Sig Dispense Refill  . azelastine (ASTELIN) 0.1 % nasal spray Place 1 spray into both nostrils at bedtime. Use in each nostril as directed    . Calcium Carbonate-Vit D-Min (CALTRATE 600+D PLUS) 600-400 MG-UNIT per tablet Take 1 tablet by mouth daily.      . cholecalciferol (VITAMIN D) 1000 UNITS tablet Take 1,000 Units by mouth daily.      Marland Kitchen. estradiol (ESTRACE) 1 MG tablet Take 1 mg by mouth daily.      . fexofenadine (ALLEGRA) 180 MG tablet Take 180 mg by mouth daily.    Marland Kitchen. latanoprost (XALATAN) 0.005 % ophthalmic solution Place 1 drop into the left eye daily.     Marland Kitchen. lisinopril (PRINIVIL,ZESTRIL) 20 MG tablet TAKE ONE TABLET BY MOUTH ONCE DAILY 90 tablet 0  . lovastatin (MEVACOR) 40 MG tablet TAKE ONE TABLET BY MOUTH TWICE DAILY 180 tablet 0  . multivitamin (THERAGRAN) per tablet Take 1 tablet by mouth daily.       No current facility-administered medications for this visit.    Past Medical History  Diagnosis Date  . Other and unspecified hyperlipidemia   . Unspecified essential hypertension   . Mitral valve insufficiency and aortic valve insufficiency     Past Surgical History  Procedure Laterality Date  . Tonsillectomy      ROS:  As stated in the HPI and  negative for all other systems.  PHYSICAL EXAM BP 128/68 mmHg  Pulse 74  Ht 5\' 2"  (1.575 m)  Wt 133 lb 8 oz (60.555 kg)  BMI 24.41 kg/m2 GENERAL:  Well appearing NECK:  No jugular venous distention, waveform within normal limits, carotid upstroke brisk and symmetric, no bruits, no thyromegaly LUNGS:  Clear to auscultation bilaterally BACK:  No CVA tenderness CHEST:  Unremarkable HEART:  PMI not displaced or sustained,S1 and S2 within normal limits, no S3, no S4, no clicks, no rubs, very brief apical systolic murmur nonradiating, murmurs ABD:  Flat, positive bowel sounds normal in frequency in pitch, no bruits, no rebound, no guarding, no midline pulsatile mass, no hepatomegaly, no splenomegaly EXT:  2 plus pulses throughout, no edema, no cyanosis no clubbing   EKG:  Sinus rhythm, rate 75, axis within normal limits, RSR prime V1 and V2, no acute ST-T wave changes.  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 Trivial mitral regurgitation 4. I will bring her back for an echocardiogram.  HTN:  The blood pressure is at target. No change in medications is indicated. We will continue with therapeutic lifestyle changes (TLC).  DYSLIPIDEMIA:  I will follow up a lipid profile.

## 2016-02-01 NOTE — Patient Instructions (Signed)
Your physician wants you to follow-up in: 1 Year. You will receive a reminder letter in the mail two months in advance. If you don't receive a letter, please call our office to schedule the follow-up appointment.  Your physician has requested that you have an echocardiogram. Echocardiography is a painless test that uses sound waves to create images of your heart. It provides your doctor with information about the size and shape of your heart and how well your heart's chambers and valves are working. This procedure takes approximately one hour. There are no restrictions for this procedure.  Your physician recommends that you return for lab work in: Fasting Lipids Liver

## 2016-02-16 ENCOUNTER — Other Ambulatory Visit (INDEPENDENT_AMBULATORY_CARE_PROVIDER_SITE_OTHER): Payer: Medicare Other | Admitting: *Deleted

## 2016-02-16 ENCOUNTER — Other Ambulatory Visit: Payer: Self-pay

## 2016-02-16 ENCOUNTER — Ambulatory Visit (HOSPITAL_COMMUNITY): Payer: Medicare Other | Attending: Cardiovascular Disease

## 2016-02-16 DIAGNOSIS — I10 Essential (primary) hypertension: Secondary | ICD-10-CM | POA: Insufficient documentation

## 2016-02-16 DIAGNOSIS — E785 Hyperlipidemia, unspecified: Secondary | ICD-10-CM | POA: Insufficient documentation

## 2016-02-16 DIAGNOSIS — R011 Cardiac murmur, unspecified: Secondary | ICD-10-CM

## 2016-02-16 DIAGNOSIS — Z8249 Family history of ischemic heart disease and other diseases of the circulatory system: Secondary | ICD-10-CM | POA: Diagnosis not present

## 2016-02-16 LAB — HEPATIC FUNCTION PANEL
ALT: 14 U/L (ref 6–29)
AST: 17 U/L (ref 10–35)
Albumin: 4.4 g/dL (ref 3.6–5.1)
Alkaline Phosphatase: 39 U/L (ref 33–130)
BILIRUBIN DIRECT: 0.1 mg/dL (ref <=0.2)
Indirect Bilirubin: 0.2 mg/dL (ref 0.2–1.2)
TOTAL PROTEIN: 6.6 g/dL (ref 6.1–8.1)
Total Bilirubin: 0.3 mg/dL (ref 0.2–1.2)

## 2016-02-16 LAB — LIPID PANEL
CHOL/HDL RATIO: 2.6 ratio (ref <=5.0)
CHOLESTEROL: 196 mg/dL (ref 125–200)
HDL: 76 mg/dL (ref >=46)
LDL Cholesterol: 98 mg/dL (ref <130)
Triglycerides: 111 mg/dL (ref <150)
VLDL: 22 mg/dL (ref <30)

## 2016-02-16 NOTE — Addendum Note (Signed)
Addended by: BOWDEN, ROBIN K on: 02/16/2016 08:03 AM   Modules accepted: Orders  

## 2016-02-16 NOTE — Addendum Note (Signed)
Addended by: Tonita PhoenixBOWDEN, ROBIN K on: 02/16/2016 08:03 AM   Modules accepted: Orders

## 2016-03-29 ENCOUNTER — Telehealth: Payer: Self-pay | Admitting: Cardiology

## 2016-03-29 NOTE — Telephone Encounter (Signed)
Pt calling to get test results from ETT 01-03-16 and Echo from 02-16-16-pls call

## 2016-04-22 ENCOUNTER — Other Ambulatory Visit: Payer: Self-pay | Admitting: Cardiology

## 2016-04-23 ENCOUNTER — Other Ambulatory Visit: Payer: Self-pay | Admitting: *Deleted

## 2016-04-23 MED ORDER — LOVASTATIN 40 MG PO TABS: 40.0000 mg | ORAL_TABLET | Freq: Two times a day (BID) | ORAL | Status: DC

## 2016-04-23 MED ORDER — LISINOPRIL 20 MG PO TABS: 20.0000 mg | ORAL_TABLET | Freq: Every day | ORAL | Status: DC

## 2017-03-30 NOTE — Progress Notes (Signed)
HPI The patient presents for follow up of HTN and hyperlipidemia.   I saw her last year and she had an echo evaluating a murmur.  She had aortic sclerosis.  Since I last saw her she has done well.  The patient denies any new symptoms such as chest discomfort, neck or arm discomfort. There has been no new shortness of breath, PND or orthopnea. There have been no reported palpitations, presyncope or syncope.  She did a great deal of walking recently on a trip to [place].  She had no problems with this.   Allergies  Allergen Reactions  . Iodine   . Statins     Muscle soreness and loss of memory    Current Outpatient Prescriptions  Medication Sig Dispense Refill  . azelastine (ASTELIN) 0.1 % nasal spray Place 1 spray into both nostrils at bedtime. Use in each nostril as directed    . Calcium Carbonate-Vit D-Min (CALTRATE 600+D PLUS) 600-400 MG-UNIT per tablet Take 1 tablet by mouth daily.      . cholecalciferol (VITAMIN D) 1000 UNITS tablet Take 1,000 Units by mouth daily.      Marland Kitchen. estradiol (ESTRACE) 1 MG tablet Take 1 mg by mouth daily.      . fexofenadine (ALLEGRA) 180 MG tablet Take 180 mg by mouth daily.    Marland Kitchen. latanoprost (XALATAN) 0.005 % ophthalmic solution Place 1 drop into the left eye daily.     Marland Kitchen. lisinopril (PRINIVIL,ZESTRIL) 20 MG tablet Take 1 tablet (20 mg total) by mouth daily. 90 tablet 3  . lovastatin (MEVACOR) 40 MG tablet Take 1 tablet (40 mg total) by mouth 2 (two) times daily. 180 tablet 3  . multivitamin (THERAGRAN) per tablet Take 1 tablet by mouth daily.       No current facility-administered medications for this visit.     Past Medical History:  Diagnosis Date  . Murmur   . Other and unspecified hyperlipidemia   . Unspecified essential hypertension     Past Surgical History:  Procedure Laterality Date  . TONSILLECTOMY      ROS:  As stated in the HPI and negative for all other systems.  PHYSICAL EXAM BP 118/72   Pulse 64   Ht 5\' 2"  (1.575 m)   Wt 130  lb 12.8 oz (59.3 kg)   BMI 23.92 kg/m   GENERAL:  Well appearing NECK:  No jugular venous distention, waveform within normal limits, carotid upstroke brisk and symmetric, no bruits, no thyromegaly LUNGS:  Clear to auscultation bilaterally CHEST:  Unremarkable HEART:  PMI not displaced or sustained,S1 and S2 within normal limits, no S3, no S4, no clicks, no rubs, 2/6 apical systolic murmur without radiation and no diastolic murmurs ABD:  Flat, positive bowel sounds normal in frequency in pitch, no bruits, no rebound, no guarding, no midline pulsatile mass, no hepatomegaly, no splenomegaly EXT:  2 plus pulses throughout, no edema, no cyanosis no clubbing  EKG:  Sinus rhythm, rate 64, axis within normal limits, RSR prime V1 and V2, no acute ST-T wave changes.  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  She has had no new symptoms and no change in physical findings.  No further imaging is indicated at this point.   HTN:  The blood pressure is at target.  No change in therapy.  DYSLIPIDEMIA:   LDL las year was as above.  I will order a lipid and liver.  She will continue current meds for now.

## 2017-04-01 ENCOUNTER — Encounter: Payer: Self-pay | Admitting: Cardiology

## 2017-04-01 ENCOUNTER — Ambulatory Visit (INDEPENDENT_AMBULATORY_CARE_PROVIDER_SITE_OTHER): Payer: Medicare Other | Admitting: Cardiology

## 2017-04-01 VITALS — BP 118/72 | HR 64 | Ht 62.0 in | Wt 130.8 lb

## 2017-04-01 DIAGNOSIS — I10 Essential (primary) hypertension: Secondary | ICD-10-CM | POA: Diagnosis not present

## 2017-04-01 DIAGNOSIS — R011 Cardiac murmur, unspecified: Secondary | ICD-10-CM

## 2017-04-01 DIAGNOSIS — E785 Hyperlipidemia, unspecified: Secondary | ICD-10-CM

## 2017-04-01 NOTE — Patient Instructions (Signed)
Medication Instructions:  Continue current medications  Labwork: Fasting Lipids Liver  Testing/Procedures: None Ordered  Follow-Up: Your physician wants you to follow-up in: 1 Year. You will receive a reminder letter in the mail two months in advance. If you don't receive a letter, please call our office to schedule the follow-up appointment.   Any Other Special Instructions Will Be Listed Below (If Applicable).   If you need a refill on your cardiac medications before your next appointment, please call your pharmacy.   

## 2017-04-08 LAB — HEPATIC FUNCTION PANEL
ALT: 13 IU/L (ref 0–32)
AST: 19 IU/L (ref 0–40)
Albumin: 4.6 g/dL (ref 3.5–4.8)
Alkaline Phosphatase: 47 IU/L (ref 39–117)
BILIRUBIN, DIRECT: 0.07 mg/dL (ref 0.00–0.40)
Bilirubin Total: 0.3 mg/dL (ref 0.0–1.2)
TOTAL PROTEIN: 6.9 g/dL (ref 6.0–8.5)

## 2017-04-08 LAB — LIPID PANEL
CHOL/HDL RATIO: 2.6 ratio (ref 0.0–4.4)
Cholesterol, Total: 185 mg/dL (ref 100–199)
HDL: 72 mg/dL (ref >39)
LDL Calculated: 89 mg/dL (ref 0–99)
Triglycerides: 118 mg/dL (ref 0–149)
VLDL Cholesterol Cal: 24 mg/dL (ref 5–40)

## 2017-04-22 ENCOUNTER — Other Ambulatory Visit: Payer: Self-pay | Admitting: Cardiology

## 2017-09-18 ENCOUNTER — Encounter: Payer: Self-pay | Admitting: Orthopedic Surgery

## 2018-04-02 NOTE — Progress Notes (Signed)
HPI The patient presents for follow up of HTN and hyperlipidemia.   She had aortic sclerosis.  Since I last saw her she has done well from a cardiac standpoint.  She is not exercising as I would like.  However, with her usual activities she denies any cardiovascular symptoms.  The patient denies any new symptoms such as chest discomfort, neck or arm discomfort. There has been no new shortness of breath, PND or orthopnea. There have been no reported palpitations, presyncope or syncope.   Allergies  Allergen Reactions  . Iodine   . Statins     Muscle soreness and loss of memory    Current Outpatient Medications  Medication Sig Dispense Refill  . azelastine (ASTELIN) 0.1 % nasal spray Place 1 spray into both nostrils at bedtime. Use in each nostril as directed    . Calcium Carbonate-Vit D-Min (CALTRATE 600+D PLUS) 600-400 MG-UNIT per tablet Take 1 tablet by mouth daily.      . cholecalciferol (VITAMIN D) 1000 UNITS tablet Take 1,000 Units by mouth daily.      Marland Kitchen estradiol (ESTRACE) 1 MG tablet Take 1 mg by mouth daily.      . fexofenadine (ALLEGRA) 180 MG tablet Take 180 mg by mouth daily.    Marland Kitchen latanoprost (XALATAN) 0.005 % ophthalmic solution Place 1 drop into the left eye daily.     Marland Kitchen lisinopril (PRINIVIL,ZESTRIL) 20 MG tablet Take 1 tablet (20 mg total) by mouth daily. 90 tablet 3  . lovastatin (MEVACOR) 40 MG tablet Take 1 tablet (40 mg total) by mouth 2 (two) times daily. 180 tablet 3  . multivitamin (THERAGRAN) per tablet Take 1 tablet by mouth daily.       No current facility-administered medications for this visit.     Past Medical History:  Diagnosis Date  . Murmur   . Other and unspecified hyperlipidemia   . Unspecified essential hypertension     Past Surgical History:  Procedure Laterality Date  . TONSILLECTOMY      ROS:  Right shoulder, back and knee pain.   As stated in the HPI and negative for all other systems.  PHYSICAL EXAM BP (!) 146/74   Pulse 83   Ht 5'  2" (1.575 m)   Wt 132 lb 3.2 oz (60 kg)   BMI 24.18 kg/m   GENERAL:  Well appearing NECK:  No jugular venous distention, waveform within normal limits, carotid upstroke brisk and symmetric, no bruits, no thyromegaly LUNGS:  Clear to auscultation bilaterally CHEST:  Unremarkable HEART:  PMI not displaced or sustained,S1 and S2 within normal limits, no S3, no S4, no clicks, no rubs, 2/6 apical systolic murmur, no diastolic murmurs ABD:  Flat, positive bowel sounds normal in frequency in pitch, no bruits, no rebound, no guarding, no midline pulsatile mass, no hepatomegaly, no splenomegaly EXT:  2 plus pulses throughout, no edema, no cyanosis no clubbing  EKG:  Sinus rhythm, rate 83, axis within normal limits, RSR prime V1 and V2, no acute ST-T wave changes.  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  No further imaging or change in therapy is planned.   HTN:  The blood pressure is mildly elevated but this is unusual.  It is well controlled at  home.  She will keep an eye on this.   DYSLIPIDEMIA:   LDL las year was as above.  I will repeat this.  I will check liver enzymes.  She also has mild anemia an I will check a CBC.

## 2018-04-03 ENCOUNTER — Ambulatory Visit: Payer: Medicare Other | Admitting: Cardiology

## 2018-04-03 ENCOUNTER — Encounter: Payer: Self-pay | Admitting: Cardiology

## 2018-04-03 VITALS — BP 146/74 | HR 83 | Ht 62.0 in | Wt 132.2 lb

## 2018-04-03 DIAGNOSIS — I358 Other nonrheumatic aortic valve disorders: Secondary | ICD-10-CM | POA: Insufficient documentation

## 2018-04-03 DIAGNOSIS — E785 Hyperlipidemia, unspecified: Secondary | ICD-10-CM | POA: Diagnosis not present

## 2018-04-03 DIAGNOSIS — Z79899 Other long term (current) drug therapy: Secondary | ICD-10-CM | POA: Diagnosis not present

## 2018-04-03 DIAGNOSIS — I10 Essential (primary) hypertension: Secondary | ICD-10-CM

## 2018-04-03 MED ORDER — LOVASTATIN 40 MG PO TABS: 40.0000 mg | ORAL_TABLET | Freq: Two times a day (BID) | ORAL | 3 refills | Status: DC

## 2018-04-03 MED ORDER — LISINOPRIL 20 MG PO TABS: 20.0000 mg | ORAL_TABLET | Freq: Every day | ORAL | 3 refills | Status: DC

## 2018-04-03 NOTE — Patient Instructions (Signed)
Medication Instructions:  Continue current medications  If you need a refill on your cardiac medications before your next appointment, please call your pharmacy.  Labwork: CBC, CMP and Fasting Lipids HERE IN OUR OFFICE AT [facility]  Take the provided lab slips with you to the lab for your blood draw.    You will need to fast. DO NOT EAT OR DRINK PAST MIDNIGHT.   Testing/Procedures: None Ordered   Follow-Up: Your physician wants you to follow-up in: 1 Year. You should receive a reminder letter in the mail two months in advance. If you do not receive a letter, please call our office [phone].     Thank you for choosing [facility]!!       

## 2018-04-13 LAB — CBC
HEMATOCRIT: 36.1 % (ref 34.0–46.6)
Hemoglobin: 12.3 g/dL (ref 11.1–15.9)
MCH: 31.4 pg (ref 26.6–33.0)
MCHC: 34.1 g/dL (ref 31.5–35.7)
MCV: 92 fL (ref 79–97)
PLATELETS: 322 10*3/uL (ref 150–450)
RBC: 3.92 x10E6/uL (ref 3.77–5.28)
RDW: 13.2 % (ref 12.3–15.4)
WBC: 7 10*3/uL (ref 3.4–10.8)

## 2018-04-13 LAB — COMPREHENSIVE METABOLIC PANEL
ALK PHOS: 44 IU/L (ref 39–117)
ALT: 17 IU/L (ref 0–32)
AST: 17 IU/L (ref 0–40)
Albumin/Globulin Ratio: 2 (ref 1.2–2.2)
Albumin: 4.7 g/dL (ref 3.5–4.8)
BUN/Creatinine Ratio: 16 (ref 12–28)
BUN: 11 mg/dL (ref 8–27)
Bilirubin Total: <0.2 mg/dL (ref 0.0–1.2)
CALCIUM: 10 mg/dL (ref 8.7–10.3)
CO2: 25 mmol/L (ref 20–29)
CREATININE: 0.7 mg/dL (ref 0.57–1.00)
Chloride: 96 mmol/L (ref 96–106)
GFR calc Af Amer: 100 mL/min/{1.73_m2} (ref >59)
GFR, EST NON AFRICAN AMERICAN: 87 mL/min/{1.73_m2} (ref >59)
GLOBULIN, TOTAL: 2.4 g/dL (ref 1.5–4.5)
GLUCOSE: 103 mg/dL — AB (ref 65–99)
Potassium: 4.7 mmol/L (ref 3.5–5.2)
SODIUM: 135 mmol/L (ref 134–144)
Total Protein: 7.1 g/dL (ref 6.0–8.5)

## 2018-04-13 LAB — LIPID PANEL
CHOL/HDL RATIO: 2 ratio (ref 0.0–4.4)
Cholesterol, Total: 193 mg/dL (ref 100–199)
HDL: 96 mg/dL (ref >39)
LDL Calculated: 77 mg/dL (ref 0–99)
Triglycerides: 99 mg/dL (ref 0–149)
VLDL CHOLESTEROL CAL: 20 mg/dL (ref 5–40)

## 2018-06-30 ENCOUNTER — Ambulatory Visit: Payer: Medicare Other | Admitting: Orthopedic Surgery

## 2018-06-30 ENCOUNTER — Telehealth: Payer: Self-pay | Admitting: Orthopedic Surgery

## 2018-06-30 ENCOUNTER — Encounter: Payer: Self-pay | Admitting: Orthopedic Surgery

## 2018-06-30 VITALS — BP 134/82 | HR 80 | Ht 62.0 in | Wt 132.0 lb

## 2018-06-30 DIAGNOSIS — R294 Clicking hip: Secondary | ICD-10-CM

## 2018-06-30 NOTE — Progress Notes (Signed)
NEW PATIENT OFFICE VISI  Chief Complaint  Patient presents with  . Leg Pain    right leg pain     72 year old female presents for evaluation of clicking in her right leg  She complains of pain in the right buttock with a clicking sensation when she is walking.  She says it feels like the bones are clicking together.  She had an injection for some type of issue with L4-5 by Dr. Danielle Dess after x-rays of the hip were normal that did not relieve her pain.  During the process of her annual physical Dr. Carlena Sax referred her here for further evaluation and treatment  The pain is reported to be mild to moderate associated with a clicking sensation.  It seems of started around a month or so prior to April so about 4 to 5 months ago   Review of Systems  Constitutional: Negative for chills, fever and weight loss.  Skin: Negative for rash.  Neurological: Negative for tingling, sensory change, focal weakness and weakness.     Past Medical History:  Diagnosis Date  . Murmur   . Other and unspecified hyperlipidemia   . Unspecified essential hypertension     Past Surgical History:  Procedure Laterality Date  . TONSILLECTOMY      History reviewed. No pertinent family history. Social History   Tobacco Use  . Smoking status: Never Smoker  . Smokeless tobacco: Never Used  Substance Use Topics  . Alcohol use: No  . Drug use: No    Allergies  Allergen Reactions  . Iodine   . Statins     Muscle soreness and loss of memory    No outpatient medications have been marked as taking for the 06/30/18 encounter (Office Visit) with Vickki Hearing, MD.    BP 134/82   Pulse 80   Ht 5\' 2"  (1.575 m)   Wt 132 lb (59.9 kg)   BMI 24.14 kg/m   Physical Exam  Constitutional: She is oriented to person, place, and time. She appears well-developed and well-nourished.  Neurological: She is alert and oriented to person, place, and time.  Psychiatric: She has a normal mood and affect. Judgment  normal.  Vitals reviewed.   Ortho Exam  The patient's back is nontender.  On the left hip she has normal flexion extension normal rotation without pain there is no tenderness over the greater trochanter.  The Pearlean Brownie test is negative for SI joint  Neurovascular exam is intact strength and muscle tone are normal there is no tremor noted.  Skin shows no rash lesion ulceration.  Hip is stable  Right hip flexion normal however with circumduction of the hip with a click is reproduced without pain  Normal range of motion is otherwise noted strength and muscle tone are normal there is no instability.  Faber test also negative  Neurovascular exam is intact skin is normal  MEDICAL DECISION SECTION  Xrays were done at Report to follow  My independent reading of xrays:  As above  Encounter Diagnosis  Name Primary?  . Clicking of right hip Yes    PLAN: (Rx., injectx, surgery, frx, mri/ct) I will review the reports and then call the patient  It seems she has a L4-5 root pain accounting for the buttock and posterior leg pain and heaviness but also has a clicking right hip which is brought on by circumduction of the hip.  This is separate from the buttock pain  No orders of the defined types were placed  in this encounter.   Fuller CanadaStanley Harrison, MD  06/30/2018 10:15 AM

## 2018-06-30 NOTE — Telephone Encounter (Signed)
Records received from Dr Danielle DessElsner today, gave them to Dr Romeo AppleHarrison for review. He stated he would call her.

## 2018-06-30 NOTE — Telephone Encounter (Signed)
Patient called to let Dr. Romeo AppleHarrison know that she had call Dr. Verlee RossettiElsner's office to get the following information for him. She was in his office on 09/24/17 and on 10/06/17 she got a cortisone shot in her back.

## 2018-07-09 ENCOUNTER — Telehealth: Payer: Self-pay | Admitting: Orthopedic Surgery

## 2018-07-09 NOTE — Telephone Encounter (Signed)
Patient called to ask if she could get a message to Dr. Romeo Apple. She was told that he would contact her and she states she hasn't heard from him. Please call and advise

## 2018-07-10 NOTE — Telephone Encounter (Signed)
Records were received from Dr Danielle Dess and placed on your desk, will you call her?

## 2018-07-20 NOTE — Telephone Encounter (Signed)
Patient called back - asked if Dr Romeo AppleHarrison has not yet called about the MRI and reports from Dr Danielle DessElsner, to please leave a voice message (it is secure) at her home [phone]

## 2018-07-20 NOTE — Telephone Encounter (Signed)
Patient called again, do you plan to call her? I can call, if you let me know what you would like for me to tell her.

## 2019-04-08 ENCOUNTER — Telehealth: Payer: Self-pay | Admitting: Cardiology

## 2019-04-09 NOTE — Telephone Encounter (Signed)
smartphone/ my chart active/ consent/ pre reg completed  °

## 2019-04-11 NOTE — Progress Notes (Signed)
Virtual Visit via Video Note   This visit type was conducted due to national recommendations for restrictions regarding the COVID-19 Pandemic (e.g. social distancing) in an effort to limit this patient's exposure and mitigate transmission in our community.  Due to her co-morbid illnesses, this patient is at least at moderate risk for complications without adequate follow up.  This format is felt to be most appropriate for this patient at this time.  All issues noted in this document were discussed and addressed.  A limited physical exam was performed with this format.  Please refer to the patient's chart for her consent to telehealth for [facility].   Date:  04/12/2019   ID:  Heather Miles, DOB 10/15/1946, MRN [id number]  Patient Location: Home Provider Location: Home  PCP:  Elfredia NevinsFusco, Lawrence, MD  Cardiologist:  Rollene RotundaJames Shyquan Stallbaumer, MD  Electrophysiologist:  None   Evaluation Performed:  Follow-Up Visit  Chief Complaint:  Hypertension, murmur  History of Present Illness:    Heather Sparenn J Peckman is a 73 y.o. female with a heart murmur and hypertension.  Since I last saw her she has done well. The patient denies any new symptoms such as chest discomfort, neck or arm discomfort. There has been no new shortness of breath, PND or orthopnea. There have been no reported palpitations, presyncope or syncope.  She is having some knee trouble but otherwise is doing well.    The patient does not have symptoms concerning for COVID-19 infection (fever, chills, cough, or new shortness of breath).    Past Medical History:  Diagnosis Date  . Murmur   . Other and unspecified hyperlipidemia   . Unspecified essential hypertension    Past Surgical History:  Procedure Laterality Date  . TONSILLECTOMY       Current Meds  Medication Sig  . azelastine (ASTELIN) 0.1 % nasal spray Place 1 spray into both nostrils at bedtime. Use in each nostril as directed  . Calcium Carbonate-Vit D-Min (CALTRATE 600+D PLUS)  600-400 MG-UNIT per tablet Take 1 tablet by mouth daily.    . cholecalciferol (VITAMIN D) 1000 UNITS tablet Take 1,000 Units by mouth daily.    Marland Kitchen. estradiol (ESTRACE) 1 MG tablet Take 1 mg by mouth daily.    . fexofenadine (ALLEGRA) 180 MG tablet Take 180 mg by mouth daily.  Marland Kitchen. latanoprost (XALATAN) 0.005 % ophthalmic solution Place 1 drop into the left eye daily.   Marland Kitchen. lisinopril (PRINIVIL,ZESTRIL) 20 MG tablet Take 1 tablet (20 mg total) by mouth daily.  Marland Kitchen. lovastatin (MEVACOR) 40 MG tablet Take 1 tablet (40 mg total) by mouth 2 (two) times daily.  . multivitamin (THERAGRAN) per tablet Take 1 tablet by mouth daily.       Allergies:   Iodine and Statins   Social History   Tobacco Use  . Smoking status: Never Smoker  . Smokeless tobacco: Never Used  Substance Use Topics  . Alcohol use: No  . Drug use: No     Family Hx: The patient's family history is not on file.  ROS:   Please see the history of present illness.    As stated in the HPI and negative for all other systems.   Prior CV studies:   The following studies were reviewed today:    Labs/Other Tests and Data Reviewed:    EKG:  No ECG reviewed.  Recent Labs: 04/13/2018: ALT 17; BUN 11; Creatinine, Ser 0.70; Hemoglobin 12.3; Platelets 322; Potassium 4.7; Sodium 135   Recent Lipid Panel  Lab Results  Component Value Date/Time   CHOL 193 04/13/2018 09:33 AM   TRIG 99 04/13/2018 09:33 AM   HDL 96 04/13/2018 09:33 AM   CHOLHDL 2.0 04/13/2018 09:33 AM   CHOLHDL 2.6 02/16/2016 08:03 AM   LDLCALC 77 04/13/2018 09:33 AM   LDLDIRECT 178.8 11/18/2013 07:41 AM    Wt Readings from Last 3 Encounters:  04/12/19 129 lb (58.5 kg)  06/30/18 132 lb (59.9 kg)  04/03/18 132 lb 3.2 oz (60 kg)     Objective:    Vital Signs:  BP 119/63   Pulse 81   Ht 5\' 2"  (1.575 m)   Wt 129 lb (58.5 kg)   BMI 23.59 kg/m    VITAL SIGNS:  reviewed GEN:  no acute distress EYES:  sclerae anicteric, EOMI - Extraocular Movements  Intact NEURO:  alert and oriented x 3, no obvious focal deficit PSYCH:  normal affect  ASSESSMENT & PLAN:    MURMUR:    She has had no symptoms.  No change in therapy is indicated.  No further studies this year.  HTN:   Her blood pressures well controlled.  She will continue on meds as listed.  DYSLIPIDEMIA:   Her LDL last year was 41 with an HDL of 96.  She is due to have this checked again this month and she will send the results to me.  She can remain on the meds as listed.  COVID-19 Education: The signs and symptoms of COVID-19 were discussed with the patient and how to seek care for testing (follow up with PCP or arrange E-visit).  The importance of social distancing was discussed today.  Time:   Today, I have spent 16 minutes with the patient with telehealth technology discussing the above problems.     Medication Adjustments/Labs and Tests Ordered: Current medicines are reviewed at length with the patient today.  Concerns regarding medicines are outlined above.   Tests Ordered: No orders of the defined types were placed in this encounter.   Medication Changes: No orders of the defined types were placed in this encounter.   Disposition:  Follow up in the office with me in six 12 months  Signed, Minus Breeding, MD  04/12/2019 9:09 AM    [employer]

## 2019-04-12 ENCOUNTER — Encounter: Payer: Self-pay | Admitting: Cardiology

## 2019-04-12 ENCOUNTER — Telehealth (INDEPENDENT_AMBULATORY_CARE_PROVIDER_SITE_OTHER): Payer: Medicare Other | Admitting: Cardiology

## 2019-04-12 VITALS — BP 119/63 | HR 81 | Ht 62.0 in | Wt 129.0 lb

## 2019-04-12 DIAGNOSIS — I10 Essential (primary) hypertension: Secondary | ICD-10-CM

## 2019-04-12 DIAGNOSIS — I358 Other nonrheumatic aortic valve disorders: Secondary | ICD-10-CM

## 2019-04-12 DIAGNOSIS — Z7189 Other specified counseling: Secondary | ICD-10-CM | POA: Insufficient documentation

## 2019-04-12 DIAGNOSIS — E785 Hyperlipidemia, unspecified: Secondary | ICD-10-CM | POA: Insufficient documentation

## 2019-04-12 MED ORDER — LOVASTATIN 40 MG PO TABS: 40.0000 mg | ORAL_TABLET | Freq: Two times a day (BID) | ORAL | 3 refills | Status: DC

## 2019-04-12 MED ORDER — LISINOPRIL 20 MG PO TABS: 20.0000 mg | ORAL_TABLET | Freq: Every day | ORAL | 3 refills | Status: DC

## 2019-04-12 NOTE — Patient Instructions (Signed)
Medication Instructions:  Continue current medications  If you need a refill on your cardiac medications before your next appointment, please call your pharmacy.  Labwork: None Ordered   Testing/Procedures: None Ordered  Follow-Up: You will need a follow up appointment in 12 months.  Please call our office 2 months in advance to schedule this appointment.  You may see James Hochrein, MD or one of the following Advanced Practice Providers on your designated Care Team:   Rhonda Barrett, PA-C . Kathryn Lawrence, DNP, ANP    At [facility], you and your health needs are our priority.  As part of our continuing mission to provide you with exceptional heart care, we have created designated Provider Care Teams.  These Care Teams include your primary Cardiologist (physician) and Advanced Practice Providers (APPs -  Physician Assistants and Nurse Practitioners) who all work together to provide you with the care you need, when you need it.  Thank you for choosing [facility]!!     

## 2019-04-18 ENCOUNTER — Other Ambulatory Visit: Payer: Self-pay | Admitting: Cardiology

## 2020-03-31 DIAGNOSIS — B354 Tinea corporis: Secondary | ICD-10-CM | POA: Diagnosis not present

## 2020-03-31 DIAGNOSIS — Z0001 Encounter for general adult medical examination with abnormal findings: Secondary | ICD-10-CM | POA: Diagnosis not present

## 2020-03-31 DIAGNOSIS — Z1389 Encounter for screening for other disorder: Secondary | ICD-10-CM | POA: Diagnosis not present

## 2020-03-31 DIAGNOSIS — E663 Overweight: Secondary | ICD-10-CM | POA: Diagnosis not present

## 2020-03-31 DIAGNOSIS — B355 Tinea imbricata: Secondary | ICD-10-CM | POA: Diagnosis not present

## 2020-03-31 DIAGNOSIS — I10 Essential (primary) hypertension: Secondary | ICD-10-CM | POA: Diagnosis not present

## 2020-03-31 DIAGNOSIS — Z6825 Body mass index (BMI) 25.0-25.9, adult: Secondary | ICD-10-CM | POA: Diagnosis not present

## 2020-03-31 DIAGNOSIS — K529 Noninfective gastroenteritis and colitis, unspecified: Secondary | ICD-10-CM | POA: Diagnosis not present

## 2020-03-31 DIAGNOSIS — E782 Mixed hyperlipidemia: Secondary | ICD-10-CM | POA: Diagnosis not present

## 2020-04-24 DIAGNOSIS — Z1211 Encounter for screening for malignant neoplasm of colon: Secondary | ICD-10-CM | POA: Diagnosis not present

## 2020-04-26 ENCOUNTER — Other Ambulatory Visit: Payer: Self-pay | Admitting: Cardiology

## 2020-04-27 DIAGNOSIS — Z1231 Encounter for screening mammogram for malignant neoplasm of breast: Secondary | ICD-10-CM | POA: Diagnosis not present

## 2020-04-27 DIAGNOSIS — Z01419 Encounter for gynecological examination (general) (routine) without abnormal findings: Secondary | ICD-10-CM | POA: Diagnosis not present

## 2020-04-27 DIAGNOSIS — Z6825 Body mass index (BMI) 25.0-25.9, adult: Secondary | ICD-10-CM | POA: Diagnosis not present

## 2020-05-02 DIAGNOSIS — J301 Allergic rhinitis due to pollen: Secondary | ICD-10-CM | POA: Diagnosis not present

## 2020-05-02 DIAGNOSIS — J3081 Allergic rhinitis due to animal (cat) (dog) hair and dander: Secondary | ICD-10-CM | POA: Diagnosis not present

## 2020-05-02 DIAGNOSIS — J4530 Mild persistent asthma, uncomplicated: Secondary | ICD-10-CM | POA: Diagnosis not present

## 2020-05-09 DIAGNOSIS — Z1211 Encounter for screening for malignant neoplasm of colon: Secondary | ICD-10-CM | POA: Diagnosis not present

## 2020-07-20 ENCOUNTER — Other Ambulatory Visit: Payer: Self-pay | Admitting: Cardiology

## 2020-10-25 ENCOUNTER — Other Ambulatory Visit: Payer: Self-pay | Admitting: Cardiology

## 2020-11-09 NOTE — Progress Notes (Signed)
Cardiology Office Note   Date:  11/10/2020   ID:  Heather Miles, DOB 09/24/1946, MRN [id number]  PCP:  Elfredia Nevins, MD  Cardiologist:   Rollene Rotunda, MD   Chief Complaint  Patient presents with  . Follow-up    1 year.  Marland Kitchen Heart Murmur      History of Present Illness: Heather Miles is a 75 y.o. female who presents for a murmur and HTN.  Since I last saw her she moved from [place] to [place].  Unfortunately her husband of 42 years died suddenly of a heart attack this fall.  She had to deal with the stress of that.  She has had no new cardiovascular complaints however.  She is not back to exercising as much as she was but with her activity she denies any cardiovascular symptoms. The patient denies any new symptoms such as chest discomfort, neck or arm discomfort. There has been no new shortness of breath, PND or orthopnea. There have been no reported palpitations, presyncope or syncope.   Past Medical History:  Diagnosis Date  . Murmur   . Other and unspecified hyperlipidemia   . Unspecified essential hypertension     Past Surgical History:  Procedure Laterality Date  . TONSILLECTOMY       Current Outpatient Medications  Medication Sig Dispense Refill  . azelastine (ASTELIN) 0.1 % nasal spray Place 1 spray into both nostrils at bedtime. Use in each nostril as directed    . Calcium Carbonate-Vit D-Min (CALTRATE 600+D PLUS) 600-400 MG-UNIT per tablet Take 1 tablet by mouth daily.    . cholecalciferol (VITAMIN D) 1000 UNITS tablet Take 1,000 Units by mouth daily.    Marland Kitchen estradiol (ESTRACE) 1 MG tablet Take 1 mg by mouth daily.    . fexofenadine (ALLEGRA) 180 MG tablet Take 180 mg by mouth daily.    Marland Kitchen latanoprost (XALATAN) 0.005 % ophthalmic solution Place 1 drop into the left eye daily.     . multivitamin (THERAGRAN) per tablet Take 1 tablet by mouth daily.    Marland Kitchen lisinopril (ZESTRIL) 20 MG tablet Take 1 tablet (20 mg total) by mouth daily. 90 tablet 3  . lovastatin  (MEVACOR) 40 MG tablet TAKE 2 TABLETS BY MOUTH DAILY 180 tablet 3   No current facility-administered medications for this visit.    Allergies:   Iodine and Statins    ROS:  Please see the history of present illness.   Otherwise, review of systems are positive for none.   All other systems are reviewed and negative.    PHYSICAL EXAM: VS:  BP 130/74 (BP Location: Left Arm, Patient Position: Sitting, Cuff Size: Normal)   Pulse (!) 108   Ht 5\' 2"  (1.575 m)   Wt 133 lb (60.3 kg)   BMI 24.33 kg/m  , BMI Body mass index is 24.33 kg/m. GENERAL:  Well appearing HEENT:  Pupils equal round and reactive, fundi not visualized, oral mucosa unremarkable NECK:  No jugular venous distention, waveform within normal limits, carotid upstroke brisk and symmetric, no bruits, no thyromegaly LYMPHATICS:  No cervical, inguinal adenopathy LUNGS:  Clear to auscultation bilaterally BACK:  No CVA tenderness CHEST:  Unremarkable HEART:  PMI not displaced or sustained,S1 and S2 within normal limits, no S3, no S4, no clicks, no rubs, 2 out of 6 brief apical systolic murmur no diastolic murmurs ABD:  Flat, positive bowel sounds normal in frequency in pitch, no bruits, no rebound, no guarding, no midline pulsatile mass, no  hepatomegaly, no splenomegaly EXT:  2 plus pulses throughout, no edema, no cyanosis no clubbing SKIN:  No rashes no nodules NEURO:  Cranial nerves II through XII grossly intact, motor grossly intact throughout PSYCH:  Cognitively intact, oriented to person place and time    EKG:  EKG is ordered today. The ekg ordered today demonstrates sinus tachycardia, rate 108, axis within normal limits, RSR prime V1 and V2   Recent Labs: No results found for requested labs within last 8760 hours.    Lipid Panel    Component Value Date/Time   CHOL 193 04/13/2018 0933   TRIG 99 04/13/2018 0933   HDL 96 04/13/2018 0933   CHOLHDL 2.0 04/13/2018 0933   CHOLHDL 2.6 02/16/2016 0803   VLDL 22  02/16/2016 0803   LDLCALC 77 04/13/2018 0933   LDLDIRECT 178.8 11/18/2013 0741      Wt Readings from Last 3 Encounters:  11/10/20 133 lb (60.3 kg)  04/12/19 129 lb (58.5 kg)  06/30/18 132 lb (59.9 kg)      Other studies Reviewed: Additional studies/ records that were reviewed today include: Labs. Review of the above records demonstrates:  Please see elsewhere in the note.     ASSESSMENT AND PLAN:   MURMUR:     This was mild aortic sclerosis.  I would not suspect this to be any worse.  No change in therapy.  HTN:  Her blood pressures is at target.  No change in therapy.   DYSLIPIDEMIA:   Total cholesterol was 217.  HDL was 74.  I do not have the complete results.  I will call to get her LDL result.  For now no change in therapy.  Current medicines are reviewed at length with the patient today.  The patient does not have concerns regarding medicines.  The following changes have been made:  no change  Labs/ tests ordered today include: None  Orders Placed This Encounter  Procedures  . EKG 12-Lead     Disposition:   FU with me in one year.     Signed, Rollene Rotunda, MD  11/10/2020 4:52 PM    [facility]

## 2020-11-10 ENCOUNTER — Ambulatory Visit (INDEPENDENT_AMBULATORY_CARE_PROVIDER_SITE_OTHER): Payer: Medicare PPO | Admitting: Cardiology

## 2020-11-10 ENCOUNTER — Encounter: Payer: Self-pay | Admitting: Cardiology

## 2020-11-10 ENCOUNTER — Other Ambulatory Visit: Payer: Self-pay

## 2020-11-10 VITALS — BP 130/74 | HR 108 | Ht 62.0 in | Wt 133.0 lb

## 2020-11-10 DIAGNOSIS — I10 Essential (primary) hypertension: Secondary | ICD-10-CM

## 2020-11-10 DIAGNOSIS — E785 Hyperlipidemia, unspecified: Secondary | ICD-10-CM | POA: Diagnosis not present

## 2020-11-10 DIAGNOSIS — I358 Other nonrheumatic aortic valve disorders: Secondary | ICD-10-CM | POA: Diagnosis not present

## 2020-11-10 MED ORDER — LOVASTATIN 40 MG PO TABS: ORAL_TABLET | ORAL | 3 refills | Status: DC

## 2020-11-10 MED ORDER — LISINOPRIL 20 MG PO TABS: 20.0000 mg | ORAL_TABLET | Freq: Every day | ORAL | 3 refills | Status: DC

## 2020-11-10 NOTE — Patient Instructions (Addendum)
Medication Instructions:  Your physician recommends that you continue on your current medications as directed. Please refer to the Current Medication list given to you today.  *If you need a refill on your cardiac medications before your next appointment, please call your pharmacy*  Lab Work: ASK YOUR PRIMARY CARE TO FAX YOUR LAST LIPID PANEL TO THE OFFICE   Testing/Procedures: NONE  Follow-Up: At [facility], you and your health needs are our priority.  As part of our continuing mission to provide you with exceptional heart care, we have created designated Provider Care Teams.  These Care Teams include your primary Cardiologist (physician) and Advanced Practice Providers (APPs -  Physician Assistants and Nurse Practitioners) who all work together to provide you with the care you need, when you need it.  We recommend signing up for the patient portal called "MyChart".  Sign up information is provided on this After Visit Summary.  MyChart is used to connect with patients for Virtual Visits (Telemedicine).  Patients are able to view lab/test results, encounter notes, upcoming appointments, etc.  Non-urgent messages can be sent to your provider as well.   To learn more about what you can do with MyChart, go to [email or web address].    Your next appointment:   12 month(s)  You will receive a reminder letter in the mail two months in advance. If you don't receive a letter, please call our office to schedule the follow-up appointment.\  The format for your next appointment:   In Person  Provider:   You may see Rollene Rotunda, MD  or one of the following Advanced Practice Providers on your designated Care Team:    Theodore Demark, PA-C  Joni Reining, DNP, ANP

## 2020-12-27 DIAGNOSIS — M25562 Pain in left knee: Secondary | ICD-10-CM | POA: Diagnosis not present

## 2020-12-27 DIAGNOSIS — M1711 Unilateral primary osteoarthritis, right knee: Secondary | ICD-10-CM | POA: Diagnosis not present

## 2021-05-03 DIAGNOSIS — J4530 Mild persistent asthma, uncomplicated: Secondary | ICD-10-CM | POA: Diagnosis not present

## 2021-05-03 DIAGNOSIS — J301 Allergic rhinitis due to pollen: Secondary | ICD-10-CM | POA: Diagnosis not present

## 2021-05-03 DIAGNOSIS — J3081 Allergic rhinitis due to animal (cat) (dog) hair and dander: Secondary | ICD-10-CM | POA: Diagnosis not present

## 2021-06-14 DIAGNOSIS — Z6824 Body mass index (BMI) 24.0-24.9, adult: Secondary | ICD-10-CM | POA: Diagnosis not present

## 2021-06-14 DIAGNOSIS — Z124 Encounter for screening for malignant neoplasm of cervix: Secondary | ICD-10-CM | POA: Diagnosis not present

## 2021-06-14 DIAGNOSIS — Z1231 Encounter for screening mammogram for malignant neoplasm of breast: Secondary | ICD-10-CM | POA: Diagnosis not present

## 2021-07-05 DIAGNOSIS — M1711 Unilateral primary osteoarthritis, right knee: Secondary | ICD-10-CM | POA: Diagnosis not present

## 2021-07-19 DIAGNOSIS — M1711 Unilateral primary osteoarthritis, right knee: Secondary | ICD-10-CM | POA: Diagnosis not present

## 2021-07-26 DIAGNOSIS — M1712 Unilateral primary osteoarthritis, left knee: Secondary | ICD-10-CM | POA: Diagnosis not present

## 2021-07-26 DIAGNOSIS — M170 Bilateral primary osteoarthritis of knee: Secondary | ICD-10-CM | POA: Diagnosis not present

## 2021-07-26 DIAGNOSIS — M1711 Unilateral primary osteoarthritis, right knee: Secondary | ICD-10-CM | POA: Diagnosis not present

## 2021-08-01 DIAGNOSIS — M170 Bilateral primary osteoarthritis of knee: Secondary | ICD-10-CM | POA: Diagnosis not present

## 2021-08-01 DIAGNOSIS — M1712 Unilateral primary osteoarthritis, left knee: Secondary | ICD-10-CM | POA: Diagnosis not present

## 2021-08-01 DIAGNOSIS — M1711 Unilateral primary osteoarthritis, right knee: Secondary | ICD-10-CM | POA: Diagnosis not present

## 2021-12-05 DIAGNOSIS — Z7901 Long term (current) use of anticoagulants: Secondary | ICD-10-CM | POA: Diagnosis not present

## 2021-12-05 DIAGNOSIS — R040 Epistaxis: Secondary | ICD-10-CM | POA: Diagnosis not present

## 2021-12-05 DIAGNOSIS — Z0130 Encounter for examination of blood pressure without abnormal findings: Secondary | ICD-10-CM | POA: Diagnosis not present

## 2022-04-16 DIAGNOSIS — J301 Allergic rhinitis due to pollen: Secondary | ICD-10-CM | POA: Diagnosis not present

## 2022-04-16 DIAGNOSIS — J4530 Mild persistent asthma, uncomplicated: Secondary | ICD-10-CM | POA: Diagnosis not present

## 2022-04-16 DIAGNOSIS — J3081 Allergic rhinitis due to animal (cat) (dog) hair and dander: Secondary | ICD-10-CM | POA: Diagnosis not present

## 2022-06-17 DIAGNOSIS — Z1321 Encounter for screening for nutritional disorder: Secondary | ICD-10-CM | POA: Diagnosis not present

## 2022-06-17 DIAGNOSIS — Z13228 Encounter for screening for other metabolic disorders: Secondary | ICD-10-CM | POA: Diagnosis not present

## 2022-06-17 DIAGNOSIS — Z131 Encounter for screening for diabetes mellitus: Secondary | ICD-10-CM | POA: Diagnosis not present

## 2022-06-17 DIAGNOSIS — R4181 Age-related cognitive decline: Secondary | ICD-10-CM | POA: Diagnosis not present

## 2022-06-17 DIAGNOSIS — Z13220 Encounter for screening for lipoid disorders: Secondary | ICD-10-CM | POA: Diagnosis not present

## 2022-06-17 DIAGNOSIS — Z01419 Encounter for gynecological examination (general) (routine) without abnormal findings: Secondary | ICD-10-CM | POA: Diagnosis not present

## 2022-06-17 DIAGNOSIS — Z1329 Encounter for screening for other suspected endocrine disorder: Secondary | ICD-10-CM | POA: Diagnosis not present

## 2022-06-17 DIAGNOSIS — Z1231 Encounter for screening mammogram for malignant neoplasm of breast: Secondary | ICD-10-CM | POA: Diagnosis not present

## 2022-06-17 DIAGNOSIS — Z130 Encounter for screening for diseases of the blood and blood-forming organs and certain disorders involving the immune mechanism: Secondary | ICD-10-CM | POA: Diagnosis not present

## 2022-07-04 ENCOUNTER — Encounter: Payer: Self-pay | Admitting: Adult Health

## 2022-07-04 ENCOUNTER — Ambulatory Visit: Payer: Medicare PPO | Admitting: Adult Health

## 2022-07-04 VITALS — BP 138/84 | HR 80 | Temp 98.1°F | Ht 62.0 in | Wt 136.0 lb

## 2022-07-04 DIAGNOSIS — J3089 Other allergic rhinitis: Secondary | ICD-10-CM | POA: Diagnosis not present

## 2022-07-04 DIAGNOSIS — F09 Unspecified mental disorder due to known physiological condition: Secondary | ICD-10-CM | POA: Diagnosis not present

## 2022-07-04 DIAGNOSIS — I10 Essential (primary) hypertension: Secondary | ICD-10-CM

## 2022-07-04 DIAGNOSIS — E785 Hyperlipidemia, unspecified: Secondary | ICD-10-CM | POA: Diagnosis not present

## 2022-07-04 MED ORDER — DONEPEZIL HCL 5 MG PO TABS: 5.0000 mg | ORAL_TABLET | Freq: Every day | ORAL | 3 refills | Status: DC

## 2022-07-04 NOTE — Progress Notes (Signed)
Location:  [facility]  Provider:  Kenard Gower DNP  Code Status:  Full Code  Goals of Care:     07/04/2022    1:13 PM  Advanced Directives  Does Patient Have a Medical Advance Directive? Yes  Type of Estate agent of [place];Living will  Copy of Healthcare Power of Attorney in Chart? Yes - validated most recent copy scanned in chart (See row information)     Chief Complaint  Patient presents with   Establish Care    NP to Establish    HPI: Patient is a 76 y.o. female seen today to establish care with [facility]. She has a PMH of hypertension and hyperlipidemia. She was accompanied by her son, Thayer Ohm. When asked who is the current [place] president, she was not able to answer but said she remembers the face. She recently moved to [place] from [place], [place]. She was referred to [facility] by her gynecologist due to memory concerns. She was noted to be repeating same questions every 15 minutes, looking disheveled when she attends church and forgetting what she has eaten/dates/events. She lives alone after her husband of 40 years died last 08-14-2020. She has 2 sons, 1 lives in [place] and the other one lives in [place]. She has a granddaughter that lives in [place].   Labs done 2 weeks ago were lipid panel, CMP, tsh, Vitamin D, CBC. Son will bring copy of results.   Past Medical History:  Diagnosis Date   Murmur    Other and unspecified hyperlipidemia    Unspecified essential hypertension     Past Surgical History:  Procedure Laterality Date   TONSILLECTOMY      Allergies  Allergen Reactions   Iodine    Statins     Muscle soreness and loss of memory    Outpatient Encounter Medications as of 07/04/2022  Medication Sig   azelastine (ASTELIN) 0.1 % nasal spray Place 1 spray into both nostrils at bedtime. Use in each nostril as directed   Calcium Carbonate-Vit D-Min (CALTRATE 600+D PLUS) 600-400 MG-UNIT per tablet Take 1 tablet by mouth daily.    cholecalciferol (VITAMIN D) 1000 UNITS tablet Take 1,000 Units by mouth daily.   donepezil (ARICEPT) 5 MG tablet Take 1 tablet (5 mg total) by mouth at bedtime.   fexofenadine (ALLEGRA) 180 MG tablet Take 180 mg by mouth daily.   fluticasone (FLONASE) 50 MCG/ACT nasal spray Place 2 sprays into both nostrils 2 (two) times daily.   latanoprost (XALATAN) 0.005 % ophthalmic solution Place 1 drop into the left eye daily.    lisinopril (ZESTRIL) 20 MG tablet Take 1 tablet (20 mg total) by mouth daily.   lovastatin (MEVACOR) 40 MG tablet TAKE 2 TABLETS BY MOUTH DAILY   multivitamin (THERAGRAN) per tablet Take 1 tablet by mouth daily.   Saccharomyces boulardii (PROBIOTIC) 250 MG CAPS Take 1 capsule by mouth daily.   [DISCONTINUED] estradiol (ESTRACE) 1 MG tablet Take 1 mg by mouth.  Every other day   [DISCONTINUED] ranitidine (ZANTAC) 75 MG tablet Take 75 mg by mouth daily.   No facility-administered encounter medications on file as of 07/04/2022.    Review of Systems:  Review of Systems  Constitutional:  Negative for appetite change, chills, fatigue and fever.  HENT:  Negative for congestion, hearing loss, rhinorrhea and sore throat.   Eyes: Negative.   Respiratory:  Negative for cough, shortness of breath and wheezing.   Cardiovascular:  Negative for chest pain, palpitations and leg swelling.  Gastrointestinal:  Negative for abdominal pain, constipation, diarrhea, nausea and vomiting.  Genitourinary:  Negative for dysuria.  Musculoskeletal:  Negative for arthralgias, back pain and myalgias.  Skin:  Negative for color change, rash and wound.  Neurological:  Negative for dizziness, weakness and headaches.  Psychiatric/Behavioral:  Negative for behavioral problems. The patient is not nervous/anxious.        Memory issues    Health Maintenance  Topic Date Due   Hepatitis C Screening  Never done   TETANUS/TDAP  Never done   Zoster Vaccines- Shingrix (1 of 2) Never done   Pneumonia Vaccine 21+  Years old (1 - PCV) Never done   DEXA SCAN  Never done   INFLUENZA VACCINE  06/04/2022   COVID-19 Vaccine  Completed   HPV VACCINES  Aged Out    Physical Exam: Vitals:   07/04/22 1311  BP: 138/84  Pulse: 80  Temp: 98.1 F (36.7 C)  TempSrc: Skin  SpO2: 98%  Weight: 136 lb (61.7 kg)  Height: 5\' 2"  (1.575 m)   Body mass index is 24.87 kg/m. Physical Exam Constitutional:      General: She is not in acute distress.    Appearance: Normal appearance.  HENT:     Head: Normocephalic and atraumatic.     Nose: Nose normal.     Mouth/Throat:     Mouth: Mucous membranes are moist.  Eyes:     Conjunctiva/sclera: Conjunctivae normal.  Cardiovascular:     Rate and Rhythm: Normal rate and regular rhythm.  Pulmonary:     Effort: Pulmonary effort is normal.     Breath sounds: Normal breath sounds.  Abdominal:     General: Bowel sounds are normal.     Palpations: Abdomen is soft.  Musculoskeletal:        General: Normal range of motion.     Cervical back: Normal range of motion.     Comments: Right knee has brace due to osteoarthritis  Skin:    General: Skin is warm and dry.  Neurological:     Mental Status: She is alert. Mental status is at baseline.  Psychiatric:        Mood and Affect: Mood normal.        Behavior: Behavior normal.        Thought Content: Thought content normal.        Judgment: Judgment normal.     Labs reviewed: Basic Metabolic Panel: No results for input(s): "NA", "K", "CL", "CO2", "GLUCOSE", "BUN", "CREATININE", "CALCIUM", "MG", "PHOS", "TSH" in the last 8760 hours. Liver Function Tests: No results for input(s): "AST", "ALT", "ALKPHOS", "BILITOT", "PROT", "ALBUMIN" in the last 8760 hours. No results for input(s): "LIPASE", "AMYLASE" in the last 8760 hours. No results for input(s): "AMMONIA" in the last 8760 hours. CBC: No results for input(s): "WBC", "NEUTROABS", "HGB", "HCT", "MCV", "PLT" in the last 8760 hours. Lipid Panel: No results for  input(s): "CHOL", "HDL", "LDLCALC", "TRIG", "CHOLHDL", "LDLDIRECT" in the last 8760 hours. No results found for: "HGBA1C"  Procedures since last visit: No results found.  Assessment/Plan  1. Essential hypertension -  BP 138/84, stable -  continue Lisinopril  2. Hyperlipidemia, unspecified hyperlipidemia type Lab Results  Component Value Date   CHOL 193 04/13/2018   HDL 96 04/13/2018   LDLCALC 77 04/13/2018   LDLDIRECT 178.8 11/18/2013   TRIG 99 04/13/2018   CHOLHDL 2.0 04/13/2018    - recently done at her gynecology clinic and waiting for result -  continue Lovastatin  3. Allergic rhinitis due to other allergic trigger, unspecified seasonality -  continue Allegra and Flonase PRN  4. Mild cognitive disorder -  21/30 MMSE, ranging in mild cognitive disorder -  will start on Aricept - donepezil (ARICEPT) 5 MG tablet; Take 1 tablet (5 mg total) by mouth at bedtime.  Dispense: 30 tablet; Refill: 3    Labs/tests ordered:  None  Next appt:  in a month

## 2022-07-04 NOTE — Patient Instructions (Signed)
Preventive Care 65 Years and Older, Female Preventive care refers to lifestyle choices and visits with your health care provider that can promote health and wellness. Preventive care visits are also called wellness exams. What can I expect for my preventive care visit? Counseling Your health care provider may ask you questions about your: Medical history, including: Past medical problems. Family medical history. Pregnancy and menstrual history. History of falls. Current health, including: Memory and ability to understand (cognition). Emotional well-being. Home life and relationship well-being. Sexual activity and sexual health. Lifestyle, including: Alcohol, nicotine or tobacco, and drug use. Access to firearms. Diet, exercise, and sleep habits. Work and work environment. Sunscreen use. Safety issues such as seatbelt and bike helmet use. Physical exam Your health care provider will check your: Height and weight. These may be used to calculate your BMI (body mass index). BMI is a measurement that tells if you are at a healthy weight. Waist circumference. This measures the distance around your waistline. This measurement also tells if you are at a healthy weight and may help predict your risk of certain diseases, such as type 2 diabetes and high blood pressure. Heart rate and blood pressure. Body temperature. Skin for abnormal spots. What immunizations do I need?  Vaccines are usually given at various ages, according to a schedule. Your health care provider will recommend vaccines for you based on your age, medical history, and lifestyle or other factors, such as travel or where you work. What tests do I need? Screening Your health care provider may recommend screening tests for certain conditions. This may include: Lipid and cholesterol levels. Hepatitis C test. Hepatitis B test. HIV (human immunodeficiency virus) test. STI (sexually transmitted infection) testing, if you are at  risk. Lung cancer screening. Colorectal cancer screening. Diabetes screening. This is done by checking your blood sugar (glucose) after you have not eaten for a while (fasting). Mammogram. Talk with your health care provider about how often you should have regular mammograms. BRCA-related cancer screening. This may be done if you have a family history of breast, ovarian, tubal, or peritoneal cancers. Bone density scan. This is done to screen for osteoporosis. Talk with your health care provider about your test results, treatment options, and if necessary, the need for more tests. Follow these instructions at home: Eating and drinking  Eat a diet that includes fresh fruits and vegetables, whole grains, lean protein, and low-fat dairy products. Limit your intake of foods with high amounts of sugar, saturated fats, and salt. Take vitamin and mineral supplements as recommended by your health care provider. Do not drink alcohol if your health care provider tells you not to drink. If you drink alcohol: Limit how much you have to 0-1 drink a day. Know how much alcohol is in your drink. In the [place] one drink equals one 12 oz bottle of beer (355 mL), one 5 oz glass of wine (148 mL), or one 1 oz glass of hard liquor (44 mL). Lifestyle Brush your teeth every morning and night with fluoride toothpaste. Floss one time each day. Exercise for at least 30 minutes 5 or more days each week. Do not use any products that contain nicotine or tobacco. These products include cigarettes, chewing tobacco, and vaping devices, such as e-cigarettes. If you need help quitting, ask your health care provider. Do not use drugs. If you are sexually active, practice safe sex. Use a condom or other form of protection in order to prevent STIs. Take aspirin only as told by   your health care provider. Make sure that you understand how much to take and what form to take. Work with your health care provider to find out whether it  is safe and beneficial for you to take aspirin daily. Ask your health care provider if you need to take a cholesterol-lowering medicine (statin). Find healthy ways to manage stress, such as: Meditation, yoga, or listening to music. Journaling. Talking to a trusted person. Spending time with friends and family. Minimize exposure to UV radiation to reduce your risk of skin cancer. Safety Always wear your seat belt while driving or riding in a vehicle. Do not drive: If you have been drinking alcohol. Do not ride with someone who has been drinking. When you are tired or distracted. While texting. If you have been using any mind-altering substances or drugs. Wear a helmet and other protective equipment during sports activities. If you have firearms in your house, make sure you follow all gun safety procedures. What's next? Visit your health care provider once a year for an annual wellness visit. Ask your health care provider how often you should have your eyes and teeth checked. Stay up to date on all vaccines. This information is not intended to replace advice given to you by your health care provider. Make sure you discuss any questions you have with your health care provider. Document Revised: 04/18/2021 Document Reviewed: 04/18/2021 Elsevier Patient Education  2023 Elsevier Inc.  

## 2022-08-08 ENCOUNTER — Ambulatory Visit: Payer: Medicare PPO | Admitting: Adult Health

## 2022-08-08 ENCOUNTER — Encounter: Payer: Self-pay | Admitting: Adult Health

## 2022-08-08 VITALS — BP 146/82 | HR 104 | Temp 97.8°F | Ht 62.0 in | Wt 134.6 lb

## 2022-08-08 DIAGNOSIS — F09 Unspecified mental disorder due to known physiological condition: Secondary | ICD-10-CM | POA: Diagnosis not present

## 2022-08-08 DIAGNOSIS — E785 Hyperlipidemia, unspecified: Secondary | ICD-10-CM

## 2022-08-08 DIAGNOSIS — G3184 Mild cognitive impairment, so stated: Secondary | ICD-10-CM | POA: Diagnosis not present

## 2022-08-08 DIAGNOSIS — J3089 Other allergic rhinitis: Secondary | ICD-10-CM

## 2022-08-08 DIAGNOSIS — I10 Essential (primary) hypertension: Secondary | ICD-10-CM | POA: Diagnosis not present

## 2022-08-08 NOTE — Progress Notes (Addendum)
[facility] clinic  Provider: Durenda Age DNP  Code Status:   Full Code  Goals of Care:     07/04/2022    1:13 PM  Advanced Directives  Does Patient Have a Medical Advance Directive? Yes  Type of Paramedic of [place];Living will  Copy of [place] in Chart? Yes - validated most recent copy scanned in chart (See row information)     Chief Complaint  Patient presents with   Medical Management of Chronic Issues    Patient presents today for a 1 month follow-up. She was started on Aricept 5 mg    Quality Metric Gaps    DEXA scan, Hep C screening, TDAP,zoster, pneumonia    HPI: Patient is a 76 y.o. female seen today for an acute visit for follow up on effect on memory after taking Aricept X 1 month. She was accompanied by her 2 sons, one from [place] and one from [place]. Patient lives by herself. Son calls her every night to remind her to take her medicine. She reported taking her Aricept everyday but there were still 27 pills left in the medication container. She has taken only 3 Aricept in a month. However, repeat MMSE score was 25/30, improved from 21/30 done 07/04/22. Son reported that on 07/04/22, patient drank wine before coming to clinic and taking the MMSE test. She has several bottles of wine at her house and still drives to the grocery store. She has not had any vehicular accidents.  BP 146/82, takes Lisinopril for hypertension.  Past Medical History:  Diagnosis Date   Murmur    Other and unspecified hyperlipidemia    Unspecified essential hypertension     Past Surgical History:  Procedure Laterality Date   TONSILLECTOMY      Allergies  Allergen Reactions   Iodine    Statins     Muscle soreness and loss of memory    Outpatient Encounter Medications as of 08/08/2022  Medication Sig   azelastine (ASTELIN) 0.1 % nasal spray Place 1 spray into both nostrils at bedtime. Use in each nostril as directed   Calcium  Carbonate-Vit D-Min (CALTRATE 600+D PLUS) 600-400 MG-UNIT per tablet Take 1 tablet by mouth daily.   cholecalciferol (VITAMIN D) 1000 UNITS tablet Take 1,000 Units by mouth daily.   donepezil (ARICEPT) 5 MG tablet Take 1 tablet (5 mg total) by mouth at bedtime.   fexofenadine (ALLEGRA) 180 MG tablet Take 180 mg by mouth daily.   fluticasone (FLONASE) 50 MCG/ACT nasal spray Place 2 sprays into both nostrils 2 (two) times daily.   latanoprost (XALATAN) 0.005 % ophthalmic solution Place 1 drop into the left eye daily.    lisinopril (ZESTRIL) 20 MG tablet Take 1 tablet (20 mg total) by mouth daily.   lovastatin (MEVACOR) 40 MG tablet TAKE 2 TABLETS BY MOUTH DAILY   multivitamin (THERAGRAN) per tablet Take 1 tablet by mouth daily.   Saccharomyces boulardii (PROBIOTIC) 250 MG CAPS Take 1 capsule by mouth daily.   No facility-administered encounter medications on file as of 08/08/2022.    Review of Systems:  Review of Systems  Constitutional:  Negative for appetite change, chills, fatigue and fever.  HENT:  Negative for congestion, hearing loss, rhinorrhea and sore throat.   Eyes: Negative.   Respiratory:  Negative for cough, shortness of breath and wheezing.   Cardiovascular:  Negative for chest pain, palpitations and leg swelling.  Gastrointestinal:  Negative for abdominal pain, constipation, diarrhea, nausea and vomiting.  Genitourinary:  Negative for dysuria.  Musculoskeletal:  Negative for arthralgias, back pain and myalgias.  Skin:  Negative for color change, rash and wound.  Neurological:  Negative for dizziness, weakness and headaches.  Psychiatric/Behavioral:  Negative for agitation, behavioral problems and hallucinations. The patient is not nervous/anxious.     Health Maintenance  Topic Date Due   Hepatitis C Screening  Never done   TETANUS/TDAP  Never done   Zoster Vaccines- Shingrix (1 of 2) Never done   Pneumonia Vaccine 58+ Years old (1 - PCV) Never done   DEXA SCAN  Never  done   INFLUENZA VACCINE  06/04/2022   COVID-19 Vaccine  Completed   HPV VACCINES  Aged Out    Physical Exam: Vitals:   08/08/22 1306  BP: (!) 146/82  Pulse: (!) 104  Temp: 97.8 F (36.6 C)  SpO2: 96%  Weight: 134 lb 9.6 oz (61.1 kg)  Height: 5\' 2"  (1.575 m)   Body mass index is 24.62 kg/m. Physical Exam Constitutional:      Appearance: Normal appearance.  HENT:     Head: Normocephalic and atraumatic.     Nose: Nose normal.     Mouth/Throat:     Mouth: Mucous membranes are moist.  Eyes:     Conjunctiva/sclera: Conjunctivae normal.  Cardiovascular:     Rate and Rhythm: Normal rate and regular rhythm.  Pulmonary:     Effort: Pulmonary effort is normal.     Breath sounds: Normal breath sounds.  Abdominal:     General: Bowel sounds are normal.     Palpations: Abdomen is soft.  Musculoskeletal:        General: Normal range of motion.     Cervical back: Normal range of motion.  Skin:    General: Skin is warm and dry.  Neurological:     General: No focal deficit present.     Mental Status: She is alert and oriented to person, place, and time.  Psychiatric:        Mood and Affect: Mood normal.        Behavior: Behavior normal.        Thought Content: Thought content normal.        Judgment: Judgment normal.    Labs reviewed: Basic Metabolic Panel: No results for input(s): "NA", "K", "CL", "CO2", "GLUCOSE", "BUN", "CREATININE", "CALCIUM", "MG", "PHOS", "TSH" in the last 8760 hours. Liver Function Tests: No results for input(s): "AST", "ALT", "ALKPHOS", "BILITOT", "PROT", "ALBUMIN" in the last 8760 hours. No results for input(s): "LIPASE", "AMYLASE" in the last 8760 hours. No results for input(s): "AMMONIA" in the last 8760 hours. CBC: No results for input(s): "WBC", "NEUTROABS", "HGB", "HCT", "MCV", "PLT" in the last 8760 hours. Lipid Panel: No results for input(s): "CHOL", "HDL", "LDLCALC", "TRIG", "CHOLHDL", "LDLDIRECT" in the last 8760 hours. No results found  for: "HGBA1C"  Procedures since last visit: No results found.  Assessment/Plan  1. Mild cognitive disorder -  MMSE score improved fro 21/30 to 25/30 -  took only 3 Aricept tablet in a month - Ambulatory referral to [place] for medication management -  Home health social worker to evaluate the need of the patient for medication management   2. Essential hypertension -  BP 146/82 -  takes Lisinopril daily -  referral with home health nurse done to do medication management and BP checks  3. Hyperlipidemia, unspecified hyperlipidemia type -  continue  Lovastatin  4. Allergic rhinitis due to other allergic trigger, unspecified seasonality -  stable, continue Flonase   Labs/tests ordered:   None  Next appt:  3 months

## 2022-08-08 NOTE — Patient Instructions (Signed)
Mild Neurocognitive Disorder Mild neurocognitive disorder, formerly known as mild cognitive impairment, is a disorder in which memory does not work as well as it should. This disorder may also cause problems with other mental functions, including thought, communication, behavior, and completion of tasks. These problems can be noticed and measured, but they usually do not interfere with daily activities or the ability to live independently. Mild neurocognitive disorder typically develops after 77 years of age, but it can also develop at younger ages. It is not as serious as major neurocognitive disorder, also known as dementia, but it may be the first sign of it. Generally, symptoms of this condition get worse over time. In rare cases, symptoms can get better. What are the causes? This condition may be caused by: Brain disorders like Alzheimer's disease, Parkinson's disease, and other conditions that gradually damage nerve cells (neurodegenerative conditions). Diseases that affect blood vessels in the brain and result in small strokes. Certain infections, such as HIV. Traumatic brain injury. Other medical conditions, such as brain tumors, underactive thyroid (hypothyroidism), and vitamin B12 deficiency. Use of certain drugs or prescription medicines. What increases the risk? The following factors may make you more likely to develop this condition: Being older than 65 years. Being female. Low education level. Diabetes, high blood pressure, high cholesterol, and other conditions that increase the risk for blood vessel diseases. Untreated or undertreated sleep apnea. Having a certain type of gene that can be passed from parent to child (inherited). Chronic health problems such as heart disease, lung disease, liver disease, kidney disease, or depression. What are the signs or symptoms? Symptoms of this condition include: Difficulty remembering. You may: Forget names, phone numbers, or details of  recent events. Forget social events and appointments. Repeatedly forget where you put your car keys or other items. Difficulty thinking and solving problems. You may have trouble with complex tasks, such as: Paying bills. Driving in unfamiliar places. Difficulty communicating. You may have trouble: Finding the right word or naming an object. Forming a sentence that makes sense, or understanding what you read or hear. Changes in your behavior or personality. When this happens, you may: Lose interest in the things that you used to enjoy. Withdraw from social situations. Get angry more easily than usual. Act before thinking. How is this diagnosed? This condition is diagnosed based on: Your symptoms. Your health care provider may ask you and the people you spend time with, such as family and friends, about your symptoms. Evaluation of mental functions (neuropsychological testing). Your health care provider may refer you to a neurologist or mental health specialist to evaluate your mental functions in detail. To identify the cause of your condition, your health care provider may: Get a detailed medical history. Ask about use of alcohol, drugs, and prescription medicines. Do a physical exam. Order blood tests and brain imaging exams. How is this treated? Mild neurocognitive disorder that is caused by medicine use, drug use, infection, or another medical condition may improve when the cause is treated, or when medicines or drugs are stopped. If this disorder has another cause, it generally does not improve and may get worse. In these cases, the goal of treatment is to help you manage the loss of mental function. Treatments in these cases include: Medicine. Medicine mainly helps memory and behavior symptoms. Talk therapy. Talk therapy provides education, emotional support, memory aids, and other ways of making up for problems with mental function. Lifestyle changes, including: Getting regular  exercise. Eating a healthy diet   that includes omega-3 fatty acids. Challenging your thinking and memory skills. Having more social interaction. Follow these instructions at home: Eating and drinking  Drink enough fluid to keep your urine pale yellow. Eat a healthy diet that includes omega-3 fatty acids. These can be found in: Fish. Nuts. Leafy vegetables. Vegetable oils. If you drink alcohol: Limit how much you use to: 0-1 drink a day for women. 0-2 drinks a day for men. Be aware of how much alcohol is in your drink. In the [place] one drink equals one 12 oz bottle of beer (355 mL), one 5 oz glass of wine (148 mL), or one 1 oz glass of hard liquor (44 mL). Lifestyle  Get regular exercise as told by your health care provider. Do not use any products that contain nicotine or tobacco, such as cigarettes, e-cigarettes, and chewing tobacco. If you need help quitting, ask your health care provider. Practice ways to manage stress. If you need help managing stress, ask your health care provider. Continue to have social interaction. Keep your mind active with stimulating activities you enjoy, such as reading or playing games. Make sure to get quality sleep. Follow these tips: Avoid napping during the day. Keep your sleeping area dark and cool. Avoid exercising during the few hours before you go to bed. Avoid caffeine products in the evening. General instructions Take over-the-counter and prescription medicines only as told by your health care provider. Your health care provider may recommend that you avoid taking medicines that can affect thinking, such as pain medicines or sleep medicines. Work with your health care provider to find out what you need help with and what your safety needs are. Keep all follow-up visits. This is important. Where to find more information National Institute on Aging: www.nia.nih.gov Contact a health care provider if: You have any new symptoms. Get help right  away if: You develop new confusion or your confusion gets worse. You act in ways that place you or your family in danger. Summary Mild neurocognitive disorder is a disorder in which memory does not work as well as it should. Mild neurocognitive disorder can have many causes. It may be the first stage of dementia. To manage your condition, get regular exercise, keep your mind active, get quality sleep, and eat a healthy diet. This information is not intended to replace advice given to you by your health care provider. Make sure you discuss any questions you have with your health care provider. Document Revised: 03/06/2020 Document Reviewed: 03/06/2020 Elsevier Patient Education  2023 Elsevier Inc.  

## 2022-08-19 ENCOUNTER — Telehealth: Payer: Self-pay | Admitting: *Deleted

## 2022-08-19 NOTE — Telephone Encounter (Signed)
Heather Miles called [facility], they told him they were now [facility]. They only have 2 places in [place] and one in [place]. Please advise.

## 2022-08-19 NOTE — Telephone Encounter (Signed)
Fonnie Jarvis called asking if Home health had been set up for his Step Mother. I saw in your notes where she had been referred to [facility]. I also looked the address and phone # to give to Step son to look into further. They were supposed to call him. Gave him information, he is going to give them a call.

## 2022-08-20 ENCOUNTER — Other Ambulatory Visit: Payer: Self-pay | Admitting: Adult Health

## 2022-08-20 DIAGNOSIS — F09 Unspecified mental disorder due to known physiological condition: Secondary | ICD-10-CM

## 2022-08-20 NOTE — Telephone Encounter (Signed)
Number was given to [place] per [facility] Medina-Vargas,NP request.

## 2022-08-20 NOTE — Telephone Encounter (Signed)
Mr. Zuidema called [facility] and they told him they no longer have an office in [place]. I called the only # I could find For [facility] on [employer], was a [id number]-888 #. I called it and the lady I spoke to gave me Oren Binet name and #: [phone] and transferred me to her. She proceeded to tell me they do not have an office in [place], and did not know when they would have one open. [name] ordered another referral and indicated [facility] in [place] not available.   I called Gauri Galvao back and explained all this to him and told him yes, the new home health care company should call him as he is traveling tomorrow.

## 2022-08-23 ENCOUNTER — Telehealth: Payer: Self-pay | Admitting: *Deleted

## 2022-08-23 NOTE — Telephone Encounter (Signed)
[name] sent another order in 08/20/2022. [place] has not yet replied. Spoke with Step son this morning it's been 2 weeks since this process started.  Still no home healthcare nurse to help patient manage her meds.

## 2022-09-02 NOTE — Telephone Encounter (Signed)
Patient son [name] called and states that it has been about 1 month since his mother has had her medications. Multiple attempts have been made with him contacting office. He states that [name]/CMA, told him she would call him back and that referral team would be calling him. After speaking to patient son he was more upset when I told him the primary contract number in the patient demographics. He states that the number is incorrect and that he specifically told administrative staff that his number is to be the primary contact. [name] we need another [place] to fax these papers to. [name] is not an option as they documented that cannot see patient. Message routed to PCP Medina-Vargas, Senaida Lange, NP

## 2022-09-02 NOTE — Telephone Encounter (Signed)
A message was sent to the referral team informing them that patient/family member is requesting a status update, as we are unable to provide one (no documentation on referral dated 08/20/22 see referral tab).  Awaiting reply

## 2022-09-03 ENCOUNTER — Telehealth: Payer: Self-pay

## 2022-09-03 NOTE — Telephone Encounter (Signed)
Fonnie Jarvis called the office again to express his frustration with our office and lack of knowledge when it comes to [place] states he spoke with Ms.Clemmons who is the LPN with [facility] and she informed him that she can not count patients pills because it is prohibited by law. [name] states that was the entire reason for the visit on 08/08/2022 with [name], it was to get someone involved to help with medication compliance.   [name] said it seems that he and [name] need to start from scratch with this situation. [name] states when patient was seen on 08/08/22, they presented with a handwritten note to inform [name] of their concerns about patient and her compliance with taking her medications, specifically her b/p medication. [name] said present was the patient, his brother and sister-in-law. Jeff's sister-in-law volunteered to assist with medication management and [name] opposed the idea and said it would be best if someone outside the family was involved. [name] explained that during the visit [name] and Jenaya (patient) went back and fourth about whether or not she was taking her medications.   [name] stated that they were relying on [name] to make a suggestion and her decision to place a referral for Home Health was not appropriate in hindsight. [name] would like for [name] to get patient set up with someone who can help manage her medications.  I will forward this message to [facility] and some of her colleagues who may be able to assist with this request.  Side Note: response should be sent to the medical assistant in Clinical Intake- Today is Anita May, if response provided tomorrow it should go to [employer].

## 2022-09-03 NOTE — Telephone Encounter (Signed)
Message left on clinical intake voicemail:   Hinton Dyer with [facility] called requesting return call at [phone]. Return call placed to [facility], I had to leave a detailed message requesting a return call and if no answer to advise on the current frequency that they have on file for patient to be seen and questioning if they feel that patient needs to be seen more frequently.  Awaiting return call from [place].  When [place] calls back, I need to find out if she is able to find out the maximum number of visits allowed per week.   I called [name] to let him know of all the interactions thus far, [name] appeared appreciative and asked that I call him back once I speak with Hinton Dyer. [name] also reiterated that [name] did say on 08/08/2022 that patient needs to be seen 2-3 times a week, however we would have to approve whatever the insurance company allows.   [name] then stated his main concern was the fact that one side doesn't know what the other is doing, our providers not communicating with one another, and thoroughly reviewing orders before signing them.

## 2022-09-03 NOTE — Addendum Note (Signed)
Addended by: Durenda Age C on: 09/03/2022 04:46 PM   Modules accepted: Orders

## 2022-09-03 NOTE — Telephone Encounter (Signed)
Below is a reply from [place], sent incorrectly:  Medina-Vargas, [name] C, NP  You 49 minutes ago (10:59 AM)    I don't recall placing an order for patient to be seen once or twice weekly. Home health can talk with patient or family how often they can see patient.   [name]     Meanwhile, [name] returned my call and stated patient has been seen since 08/14/2022. [name] does not have access to see the frequency orders and plans to send a message to Hinton Dyer (Nurse Case Freight forwarder), requesting that she call me to further discuss.   I asked [name] to clarify who Ms.Clemmon's was and patients son, [name] stated that he is expecting a call from Ms.Clemmon's following patients home health visit today. [name] advised that Ms.Clemmons is the LPN that will visit patient today.  Awaiting call from Hinton Dyer, Nurse Case Manager

## 2022-09-03 NOTE — Telephone Encounter (Signed)
Having never seen this patient, I am perpkxed as to what is going on

## 2022-09-03 NOTE — Telephone Encounter (Signed)
Patients son Heather Miles called today to express his frustration about the referral process and communication between providers. Heather Miles was initially speaking with [name], RMA who placed him on a brief hold and asked that I take over the call. Upon picking up on the call, I introduced myself, however Heather Miles continued to refer to me as [name] states patient was seen by [facility] and has a planned visit today. Heather Miles states he spoke with [name] with [name] and [name] expressed that [place] signed some paperwork ok'ing the frequency of patient being seen once every 3 week, however [name] stated patient needs to be seen 2 times weekly. Heather Miles is questioning why did the providers not communicate upon giving 2 different frequency orders? Patient would like for [name] and [name] to review this message and provide him with an explanation. Please advise  Heather Miles states he is upset that [name] (another Psychologist, sport and exercise) gave him a number for [name] on 08/19/22 and he called the number [phone] and was told they do not provide home health services. Heather Miles emphasized that the number he has for [name] is [phone], and that is the number on there website. Heather Miles states there is a lot of confusion with this entire situation and he would like to know what we are doing to avoid this in the future.   Heather Miles also mentioned that he does not know why we did not know patient was already established/seen by [name] and someone documented in a referral that [name] would not take patient on (see referral dated 08/08/22), which prompted [name] to do another referral on 08/20/22.   I apologized to Heather Miles that this has been an unpleasant experience. I told Heather Miles that I would like to focus on what I can do in the present moment to get his mother the care that is needed. I informed Heather Miles that I will call [name] to see what is needed from [place] at this point to assure that his mother is seen at the frequency that [name]  indicated of 2 times weekly.   I called [name] with [name] at [phone] and left a detailed message requesting a return call. Awaiting return call.   Heather Miles is expecting a reply from [name] and [name] on why there was a communication breakdown.

## 2022-09-03 NOTE — Telephone Encounter (Signed)
Called Heather Miles and recommended:   1.) PCS form to be filled out 2.) [employer] [phone] 3.) Home Health Copywriter, advertising.   Heather Miles is going to place a referral for the Home Health Copywriter, advertising.

## 2022-09-03 NOTE — Telephone Encounter (Signed)
Below is the reply from the referral team:

## 2022-09-03 NOTE — Telephone Encounter (Signed)
I spoke with Heather Miles again and her stated that he spoke with Heather Miles who works for [employer] and she also shared the information that Heather Miles left for me. Heather Miles sates that he has requested the records from [facility] via [facility] to find out if it's documented that patient has taken her medications regularly as that was to be part of the orders .  Heather Miles let me know that he is 4 hours away and Heather Miles suggested that patient not have a family member to assist with medication management, that assistance comes from outside sources, such as [place]. Heather Miles stated he is now learning that there is a difference between [place]. Heather Miles states he was also informed that patient was seen x 4, which he was not aware of. Patient has 2 appointments pending 09/17/22 and 10/01/22.    Heather Miles states that he hopes the orders that are sent for [name] in the future will be reviewed by [facility] as well and thoroughly reviewed prior to signing.   Heather Miles did not need anything additional from [place] at this time.

## 2022-09-03 NOTE — Telephone Encounter (Signed)
Message left on Clinical Intake Voicemail:  Hinton Dyer with [facility] returned call and stated that LPN saw patient this morning.   Stated that patient is driving herself anywhere she wants to go. Stated that they do not provide "Checking in on a patient" stated that it is not a Skilled need per Medicare.   Hinton Dyer stated that with the way the patient acts when they see her she Cannot justify the Question of Frequency. Stated that it has been Weekly but after this week the frequency will be going to Every Other Week until the end of the Certification Period.   Stated that if there were anyother questions to call her back at [phone]

## 2022-09-04 ENCOUNTER — Other Ambulatory Visit: Payer: Self-pay | Admitting: Cardiology

## 2022-09-05 ENCOUNTER — Other Ambulatory Visit: Payer: Self-pay | Admitting: Cardiology

## 2022-09-05 ENCOUNTER — Telehealth: Payer: Self-pay | Admitting: Adult Health

## 2022-09-05 NOTE — Telephone Encounter (Signed)
09/03/22 Talked to son, [name], and discussed referral to Home health social worker to assess services needed at home  and having PCS worker. I have given him the phone number to [employer] and contact person Rush Barer.

## 2022-10-01 ENCOUNTER — Other Ambulatory Visit: Payer: Self-pay | Admitting: Cardiology

## 2022-10-03 ENCOUNTER — Telehealth: Payer: Self-pay | Admitting: *Deleted

## 2022-10-03 NOTE — Telephone Encounter (Signed)
Trey Paula, Son, called requesting to speak with [name]. Stated that he wants to know the next step for patient care. [facility] Discharged patient today from services and recommended him to call provider to see what next step will be.   Son is requesting to speak with [name] directly.  Please Call #[phone]

## 2022-10-04 ENCOUNTER — Other Ambulatory Visit: Payer: Self-pay | Admitting: Adult Health

## 2022-10-04 DIAGNOSIS — F09 Unspecified mental disorder due to known physiological condition: Secondary | ICD-10-CM

## 2022-10-04 NOTE — Telephone Encounter (Signed)
Medina-Vargas, [name] C, NP  You2 minutes ago (12:57 PM)   Referred to [facility], need assistance with ADLs, medication management and Child psychotherapist.     Trey Paula Notified and agreed.

## 2022-10-07 ENCOUNTER — Telehealth: Payer: Self-pay | Admitting: *Deleted

## 2022-10-07 NOTE — Progress Notes (Signed)
  Care Coordination   Note   10/07/2022 Name: Heather Miles MRN: [id number] DOB: 28-Jan-1946  Heather Miles is a 76 y.o. year old female who sees Medina-Vargas, [name] C, NP for primary care. I reached out to Viviann Spare by phone today to offer care coordination services.  [name] was given information about Care Coordination services today including:   The Care Coordination services include support from the care team which includes your Nurse Coordinator, Clinical Social Worker, or Pharmacist.  The Care Coordination team is here to help remove barriers to the health concerns and goals most important to you. Care Coordination services are voluntary, and the patient may decline or stop services at any time by request to their care team member.   Care Coordination Consent Status: Patient POA son Heather Miles agreed to services and verbal consent obtained.   Follow up plan:  Telephone appointment with care coordination team member scheduled for:  10/09/22  Encounter Outcome:  Pt. Scheduled  [facility] Coordination Care Guide  Direct Dial: [phone]

## 2022-10-09 ENCOUNTER — Ambulatory Visit: Payer: Self-pay | Admitting: Licensed Clinical Social Worker

## 2022-10-10 ENCOUNTER — Telehealth: Payer: Self-pay | Admitting: *Deleted

## 2022-10-10 ENCOUNTER — Emergency Department (HOSPITAL_BASED_OUTPATIENT_CLINIC_OR_DEPARTMENT_OTHER): Payer: Medicare PPO

## 2022-10-10 ENCOUNTER — Ambulatory Visit: Payer: Medicare PPO | Admitting: Family

## 2022-10-10 ENCOUNTER — Encounter (HOSPITAL_BASED_OUTPATIENT_CLINIC_OR_DEPARTMENT_OTHER): Payer: Self-pay | Admitting: *Deleted

## 2022-10-10 ENCOUNTER — Emergency Department (HOSPITAL_BASED_OUTPATIENT_CLINIC_OR_DEPARTMENT_OTHER)
Admission: EM | Admit: 2022-10-10 | Discharge: 2022-10-11 | Disposition: A | Discharge status: Home / Self Care | Payer: Medicare PPO | Attending: Emergency Medicine | Admitting: Emergency Medicine

## 2022-10-10 ENCOUNTER — Other Ambulatory Visit: Payer: Self-pay

## 2022-10-10 DIAGNOSIS — N179 Acute kidney failure, unspecified: Secondary | ICD-10-CM | POA: Diagnosis not present

## 2022-10-10 DIAGNOSIS — Z79899 Other long term (current) drug therapy: Secondary | ICD-10-CM | POA: Diagnosis not present

## 2022-10-10 DIAGNOSIS — I10 Essential (primary) hypertension: Secondary | ICD-10-CM | POA: Insufficient documentation

## 2022-10-10 DIAGNOSIS — I6782 Cerebral ischemia: Secondary | ICD-10-CM | POA: Diagnosis not present

## 2022-10-10 DIAGNOSIS — R1110 Vomiting, unspecified: Secondary | ICD-10-CM | POA: Diagnosis present

## 2022-10-10 DIAGNOSIS — I700 Atherosclerosis of aorta: Secondary | ICD-10-CM | POA: Insufficient documentation

## 2022-10-10 DIAGNOSIS — K5730 Diverticulosis of large intestine without perforation or abscess without bleeding: Secondary | ICD-10-CM | POA: Insufficient documentation

## 2022-10-10 DIAGNOSIS — E860 Dehydration: Secondary | ICD-10-CM | POA: Diagnosis not present

## 2022-10-10 LAB — COMPREHENSIVE METABOLIC PANEL
ALT: 35 U/L (ref 0–44)
AST: 30 U/L (ref 15–41)
Albumin: 4.9 g/dL (ref 3.5–5.0)
Alkaline Phosphatase: 57 U/L (ref 38–126)
Anion gap: 24 — ABNORMAL HIGH (ref 5–15)
BUN: 54 mg/dL — ABNORMAL HIGH (ref 8–23)
CO2: 20 mmol/L — ABNORMAL LOW (ref 22–32)
Calcium: 10.3 mg/dL (ref 8.9–10.3)
Chloride: 95 mmol/L — ABNORMAL LOW (ref 98–111)
Creatinine, Ser: 1.73 mg/dL — ABNORMAL HIGH (ref 0.44–1.00)
GFR, Estimated: 30 mL/min — ABNORMAL LOW (ref >60)
Glucose, Bld: 153 mg/dL — ABNORMAL HIGH (ref 70–99)
Potassium: 3.8 mmol/L (ref 3.5–5.1)
Sodium: 139 mmol/L (ref 135–145)
Total Bilirubin: 0.6 mg/dL (ref 0.3–1.2)
Total Protein: 8 g/dL (ref 6.5–8.1)

## 2022-10-10 LAB — CBC
HCT: 41.1 % (ref 36.0–46.0)
Hemoglobin: 14.3 g/dL (ref 12.0–15.0)
MCH: 34.1 pg — ABNORMAL HIGH (ref 26.0–34.0)
MCHC: 34.8 g/dL (ref 30.0–36.0)
MCV: 98.1 fL (ref 80.0–100.0)
Platelets: 299 10*3/uL (ref 150–400)
RBC: 4.19 MIL/uL (ref 3.87–5.11)
RDW: 12.3 % (ref 11.5–15.5)
WBC: 8.5 10*3/uL (ref 4.0–10.5)
nRBC: 0.2 % (ref 0.0–0.2)

## 2022-10-10 LAB — BASIC METABOLIC PANEL
Anion gap: 18 — ABNORMAL HIGH (ref 5–15)
BUN: 47 mg/dL — ABNORMAL HIGH (ref 8–23)
CO2: 20 mmol/L — ABNORMAL LOW (ref 22–32)
Calcium: 8.2 mg/dL — ABNORMAL LOW (ref 8.9–10.3)
Chloride: 104 mmol/L (ref 98–111)
Creatinine, Ser: 1.21 mg/dL — ABNORMAL HIGH (ref 0.44–1.00)
GFR, Estimated: 46 mL/min — ABNORMAL LOW (ref >60)
Glucose, Bld: 125 mg/dL — ABNORMAL HIGH (ref 70–99)
Potassium: 3.5 mmol/L (ref 3.5–5.1)
Sodium: 142 mmol/L (ref 135–145)

## 2022-10-10 LAB — LIPASE, BLOOD: Lipase: 39 U/L (ref 11–51)

## 2022-10-10 LAB — ETHANOL: Alcohol, Ethyl (B): <10 mg/dL (ref <10)

## 2022-10-10 MED ORDER — SODIUM CHLORIDE 0.9 % IV BOLUS (SEPSIS)
1000.0000 mL | Freq: Once | INTRAVENOUS | Status: AC
  Administered 2022-10-10: 1000 mL via INTRAVENOUS

## 2022-10-10 MED ORDER — SODIUM CHLORIDE 0.9 % IV SOLN
1000.0000 mL | INTRAVENOUS | Status: DC
  Administered 2022-10-11: 1000 mL via INTRAVENOUS

## 2022-10-10 MED ORDER — ONDANSETRON HCL 4 MG/2ML IJ SOLN
4.0000 mg | Freq: Once | INTRAMUSCULAR | Status: AC
  Administered 2022-10-10: 4 mg via INTRAVENOUS
  Filled 2022-10-10: qty 2

## 2022-10-10 NOTE — Telephone Encounter (Signed)
Trey Paula Notified and agreed and will take to ER Canceled appointment.

## 2022-10-10 NOTE — ED Notes (Signed)
Pt had diarrhea but no urine

## 2022-10-10 NOTE — ED Provider Notes (Addendum)
MEDCENTER [facility] EMERGENCY DEPT Provider Note   CSN: [id number] Arrival date & time: 10/10/22  1539     History  Chief Complaint  Patient presents with   Emesis    Heather Miles is a 76 y.o. female.   Emesis    Patient has a history of hyperlipidemia, hypertension.  She also has been developing some cognitive difficulties.  Patient's husband has passed away.  Patient now lives by herself at home.  Patient has 2 stepsons that help care for her.  1 lives in [place] and 1 lives in [place].  Patient was prescribed Aricept by her primary doctor previously but has not been consistently taking them.  Family went to check on the patient today because she did not pick up the phone yesterday.  They sent a neighbor over to check on her and the patient seemed confused and she was wearing the same close for the last few days.  Patient also had vomited a few times.  Family called the doctor's office and they suggested going to the emergency department.  Patient herself denies any complaints.  She denies having a headache no chest pain no abdominal pain.  Family has noticed she has had weight loss in the last several months.  I do's have some concerns [place] about her ability to continue to live at home alone.  Patient also does continue to drive  Home Medications Prior to Admission medications   Medication Sig Start Date End Date Taking? Authorizing Provider  azelastine (ASTELIN) 0.1 % nasal spray Place 1 spray into both nostrils at bedtime. Use in each nostril as directed    [provider]  Calcium Carbonate-Vit D-Min (CALTRATE 600+D PLUS) 600-400 MG-UNIT per tablet Take 1 tablet by mouth daily.    [provider]  cholecalciferol (VITAMIN D) 1000 UNITS tablet Take 1,000 Units by mouth daily.    [provider]  donepezil (ARICEPT) 5 MG tablet Take 1 tablet (5 mg total) by mouth at bedtime. 07/04/22   Medina-Vargas, [name] C, NP  fexofenadine (ALLEGRA) 180 MG  tablet Take 180 mg by mouth daily.    [provider]  fluticasone (FLONASE) 50 MCG/ACT nasal spray Place 2 sprays into both nostrils 2 (two) times daily.    [provider]  latanoprost (XALATAN) 0.005 % ophthalmic solution Place 1 drop into the left eye daily.  09/30/14   [provider]  lisinopril (ZESTRIL) 20 MG tablet Take 1 tablet by mouth once daily 09/06/22   Rollene Rotunda, MD  lovastatin (MEVACOR) 40 MG tablet TAKE 1 TABLET BY MOUTH ONCE DAILY . APPOINTMENT REQUIRED FOR FUTURE REFILLS 10/02/22   Rollene Rotunda, MD  multivitamin [facility]) per tablet Take 1 tablet by mouth daily.    [provider]  Saccharomyces boulardii (PROBIOTIC) 250 MG CAPS Take 1 capsule by mouth daily.    [provider]      Allergies    Iodine and Statins    Review of Systems   Review of Systems  Gastrointestinal:  Positive for vomiting.    Physical Exam Updated Vital Signs BP (!) 123/91   Pulse 75   Temp 97.8 F (36.6 C) (Oral)   Resp 16   Wt 50.4 kg   SpO2 99%   BMI 20.34 kg/m  Physical Exam Vitals and nursing note reviewed.  Constitutional:      Appearance: She is well-developed. She is not diaphoretic.  HENT:     Head: Normocephalic and atraumatic.  Right Ear: External ear normal.     Left Ear: External ear normal.  Eyes:     General: No scleral icterus.       Right eye: No discharge.        Left eye: No discharge.     Conjunctiva/sclera: Conjunctivae normal.  Neck:     Trachea: No tracheal deviation.  Cardiovascular:     Rate and Rhythm: Normal rate and regular rhythm.  Pulmonary:     Effort: Pulmonary effort is normal. No respiratory distress.     Breath sounds: Normal breath sounds. No stridor. No wheezing or rales.  Abdominal:     General: Bowel sounds are normal. There is no distension.     Palpations: Abdomen is soft.     Tenderness: There is no abdominal tenderness. There is no guarding or rebound.  Musculoskeletal:         General: No tenderness or deformity.     Cervical back: Neck supple.  Skin:    General: Skin is warm and dry.     Findings: No rash.  Neurological:     General: No focal deficit present.     Mental Status: She is alert.     Cranial Nerves: No cranial nerve deficit (no facial droop, extraocular movements intact, no slurred speech).     Sensory: No sensory deficit.     Motor: No abnormal muscle tone or seizure activity.     Coordination: Coordination normal.  Psychiatric:        Mood and Affect: Mood normal.     ED Results / Procedures / Treatments   Labs (all labs ordered are listed, but only abnormal results are displayed) Labs Reviewed  COMPREHENSIVE METABOLIC PANEL - Abnormal; Notable for the following components:      Result Value   Chloride 95 (*)    CO2 20 (*)    Glucose, Bld 153 (*)    BUN 54 (*)    Creatinine, Ser 1.73 (*)    GFR, Estimated 30 (*)    Anion gap 24 (*)    All other components within normal limits  CBC - Abnormal; Notable for the following components:   MCH 34.1 (*)    All other components within normal limits  LIPASE, BLOOD  ETHANOL  URINALYSIS, ROUTINE W REFLEX MICROSCOPIC  BASIC METABOLIC PANEL    EKG None  Radiology CT Head Wo Contrast  Result Date: 10/10/2022 CLINICAL DATA:  Initial evaluation for acute mental status change, unknown cause. EXAM: CT HEAD WITHOUT CONTRAST TECHNIQUE: Contiguous axial images were obtained from the base of the skull through the vertex without intravenous contrast. RADIATION DOSE REDUCTION: This exam was performed according to the departmental dose-optimization program which includes automated exposure control, adjustment of the mA and/or kV according to patient size and/or use of iterative reconstruction technique. COMPARISON:  None Available. FINDINGS: Brain: Generalized age-related cerebral atrophy with mild chronic small vessel ischemic disease. No acute intracranial hemorrhage. No acute large vessel  territory infarct. No mass lesion, midline shift or mass effect. No hydrocephalus or extra-axial fluid collection. Vascular: No abnormal hyperdense vessel. Scattered vascular calcifications noted within the carotid siphons. Skull: Scalp soft tissues and calvarium demonstrate no acute finding. Sinuses/Orbits: Globes are soft tissues within normal limits. Visualized paranasal sinuses are clear. No mastoid effusion. Other: None. IMPRESSION: 1. No acute intracranial abnormality. 2. Generalized age-related cerebral atrophy with mild chronic small vessel ischemic disease. Electronically Signed   By: Jeannine Boga M.D.   On: 10/10/2022 19:32  CT ABDOMEN PELVIS WO CONTRAST  Result Date: 10/10/2022 CLINICAL DATA:  Nausea, vomiting, fever EXAM: CT ABDOMEN AND PELVIS WITHOUT CONTRAST TECHNIQUE: Multidetector CT imaging of the abdomen and pelvis was performed following the standard protocol without IV contrast. RADIATION DOSE REDUCTION: This exam was performed according to the departmental dose-optimization program which includes automated exposure control, adjustment of the mA and/or kV according to patient size and/or use of iterative reconstruction technique. COMPARISON:  None Available. FINDINGS: Lower chest: No acute abnormality Hepatobiliary: No focal liver abnormality is seen. Status post cholecystectomy. No biliary dilatation. Pancreas: No focal abnormality or ductal dilatation. Spleen: No focal abnormality.  Normal size. Adrenals/Urinary Tract: No adrenal abnormality. No focal renal abnormality. No stones or hydronephrosis. Urinary bladder is unremarkable. Stomach/Bowel: Left colonic diverticulosis. No active diverticulitis. Stomach and small bowel decompressed. No bowel obstruction. Vascular/Lymphatic: Aortic atherosclerosis. No evidence of aneurysm or adenopathy. Reproductive: Prior hysterectomy.  No adnexal masses. Other: No free fluid or free air. Musculoskeletal: No acute bony abnormality. IMPRESSION:  No acute findings in the abdomen or pelvis. Left colonic diverticulosis. Aortic atherosclerosis. Electronically Signed   By: Rolm Baptise M.D.   On: 10/10/2022 19:31    Procedures Procedures    Medications Ordered in ED Medications  sodium chloride 0.9 % bolus 1,000 mL (1,000 mLs Intravenous New Bag/Given 10/10/22 1942)    Followed by  0.9 %  sodium chloride infusion (has no administration in time range)  ondansetron (ZOFRAN) injection 4 mg (4 mg Intravenous Given 10/10/22 1942)    ED Course/ Medical Decision Making/ A&P Clinical Course as of 10/10/22 2347  Thu Oct 10, 2022  1948 CT ABDOMEN PELVIS WO CONTRAST No acute abnormalities abdominal pelvic CT [JK]  1948 CT Head Wo Contrast Head CT without acute abnormality [JK]  1949 No anemia noted on labs. [JK]  1949 BUN and creatinine are elevated [JK]  2140 Patient has been able to eat and tolerate fluids.  She has been given IV fluid hydration.  Will recheck metabolic panel [JK]  123XX123 Basic metabolic panel(!) Repeat BUN and creatinine improved. [JK]    Clinical Course User Index [JK] Dorie Rank, MD                           Medical Decision Making Problems Addressed: AKI (acute kidney injury) [facility]): acute illness or injury that poses a threat to life or bodily functions Dehydration: acute illness or injury that poses a threat to life or bodily functions  Amount and/or Complexity of Data Reviewed Labs: ordered. Decision-making details documented in ED Course. Radiology: ordered and independent interpretation performed. Decision-making details documented in ED Course.  Risk Prescription drug management.   Patient presented to ED for evaluation of nausea vomiting confusion.  Patient has been diagnosed with mild dementia cognitive difficulties.  Family believes her symptoms are progressing.  Patient was found at home having not changed.  She had been having some episodes of nausea and vomiting.  ER workup was notable for elevated  BUN/creatinine consistent with acute kidney injury.  Patient has not had any hematemesis.  No blood in her stool.  CT scan was performed and it does not show any acute abnormality.  With her cognitive changes CT scan of the head was performed and that was also negative.  There was history of some alcohol use in the past so an ethanol level was attained and that was also negative.  Patient denies any symptoms here.  She has improved  and is tolerated oral food and fluids.  Urinalysis is currently pending and I have repeated her metabolic panel to check if there is improvement.  Otherwise anticipate that she will be able to go home with close outpatient follow-up.  Family plans on having caregivers come to her house daily to check on her.  UA pending at shift change.  Repeat metabolic panel shows improvement      Final Clinical Impression(s) / ED Diagnoses Final diagnoses:  Dehydration  AKI (acute kidney injury) [facility])    Rx / DC Orders ED Discharge Orders     None         Dorie Rank, MD 10/10/22 2330    Dorie Rank, MD 10/10/22 2352

## 2022-10-10 NOTE — ED Triage Notes (Signed)
Pt is brought in by her step son for illness.  Pt has been sick for a week with nausea and vomiting.  Pt reports some fever with this and lack of appetite.  Vomiting today has been bile content, yellowish.

## 2022-10-10 NOTE — Telephone Encounter (Signed)
Recommend ED for evaluation

## 2022-10-10 NOTE — ED Notes (Signed)
Helped pt to bathroom to void, await urine sample

## 2022-10-10 NOTE — Telephone Encounter (Signed)
Pat placed patient on Schedule to see Carilyn Goodpasture today with concerns of Dehydrated/Possible Stroke.   I called step Son, Trey Paula and he stated that he tried calling patient yesterday and she would not pick up and it went straight to Voicemail. Stated the same thing happened today so they sent a neighbor over to check on her.   Stated that patient would not come to door so they gave the code to the garage to get in the door and neighbor went in and stated that patient was confused and wearing the same cloths she had on 3 days ago.   Trey Paula is not sure if patient is just dehydrated, possible stroke or worsening Dementia and wants patient to be evaluated in office.   Has an appointment today at 2:40 with Dinah.

## 2022-10-11 LAB — URINALYSIS, ROUTINE W REFLEX MICROSCOPIC
Bilirubin Urine: NEGATIVE
Glucose, UA: NEGATIVE mg/dL
Hgb urine dipstick: NEGATIVE
Ketones, ur: 40 mg/dL — AB
Leukocytes,Ua: NEGATIVE
Nitrite: NEGATIVE
Protein, ur: 30 mg/dL — AB
Specific Gravity, Urine: 1.021 (ref 1.005–1.030)
pH: 5.5 (ref 5.0–8.0)

## 2022-10-11 NOTE — ED Provider Notes (Signed)
Nursing notes and vitals signs, including pulse oximetry, reviewed.  Summary of this visit's results, reviewed by myself:  EKG:  EKG Interpretation  Date/Time:    Ventricular Rate:    PR Interval:    QRS Duration:   QT Interval:    QTC Calculation:   R Axis:     Text Interpretation:          Labs:  Results for orders placed or performed during the hospital encounter of 10/10/22 (from the past 24 hour(s))  Lipase, blood     Status: None   Collection Time: 10/10/22  4:37 PM  Result Value Ref Range   Lipase 39 11 - 51 U/L  Comprehensive metabolic panel     Status: Abnormal   Collection Time: 10/10/22  4:37 PM  Result Value Ref Range   Sodium 139 135 - 145 mmol/L   Potassium 3.8 3.5 - 5.1 mmol/L   Chloride 95 (L) 98 - 111 mmol/L   CO2 20 (L) 22 - 32 mmol/L   Glucose, Bld 153 (H) 70 - 99 mg/dL   BUN 54 (H) 8 - 23 mg/dL   Creatinine, Ser 2.97 (H) 0.44 - 1.00 mg/dL   Calcium 98.9 8.9 - 21.1 mg/dL   Total Protein 8.0 6.5 - 8.1 g/dL   Albumin 4.9 3.5 - 5.0 g/dL   AST 30 15 - 41 U/L   ALT 35 0 - 44 U/L   Alkaline Phosphatase 57 38 - 126 U/L   Total Bilirubin 0.6 0.3 - 1.2 mg/dL   GFR, Estimated 30 (L) >60 mL/min   Anion gap 24 (H) 5 - 15  CBC     Status: Abnormal   Collection Time: 10/10/22  4:37 PM  Result Value Ref Range   WBC 8.5 4.0 - 10.5 K/uL   RBC 4.19 3.87 - 5.11 MIL/uL   Hemoglobin 14.3 12.0 - 15.0 g/dL   HCT 94.1 74.0 - 81.4 %   MCV 98.1 80.0 - 100.0 fL   MCH 34.1 (H) 26.0 - 34.0 pg   MCHC 34.8 30.0 - 36.0 g/dL   RDW 48.1 85.6 - 31.4 %   Platelets 299 150 - 400 K/uL   nRBC 0.2 0.0 - 0.2 %  Ethanol     Status: None   Collection Time: 10/10/22  7:36 PM  Result Value Ref Range   Alcohol, Ethyl (B) <10 <10 mg/dL  Basic metabolic panel     Status: Abnormal   Collection Time: 10/10/22 10:56 PM  Result Value Ref Range   Sodium 142 135 - 145 mmol/L   Potassium 3.5 3.5 - 5.1 mmol/L   Chloride 104 98 - 111 mmol/L   CO2 20 (L) 22 - 32 mmol/L   Glucose, Bld  125 (H) 70 - 99 mg/dL   BUN 47 (H) 8 - 23 mg/dL   Creatinine, Ser 9.70 (H) 0.44 - 1.00 mg/dL   Calcium 8.2 (L) 8.9 - 10.3 mg/dL   GFR, Estimated 46 (L) >60 mL/min   Anion gap 18 (H) 5 - 15  Urinalysis, Routine w reflex microscopic     Status: Abnormal   Collection Time: 10/11/22  2:02 AM  Result Value Ref Range   Color, Urine YELLOW YELLOW   APPearance CLEAR CLEAR   Specific Gravity, Urine 1.021 1.005 - 1.030   pH 5.5 5.0 - 8.0   Glucose, UA NEGATIVE NEGATIVE mg/dL   Hgb urine dipstick NEGATIVE NEGATIVE   Bilirubin Urine NEGATIVE NEGATIVE   Ketones, ur 40 (A) NEGATIVE  mg/dL   Protein, ur 30 (A) NEGATIVE mg/dL   Nitrite NEGATIVE NEGATIVE   Leukocytes,Ua NEGATIVE NEGATIVE   RBC / HPF 0-5 0 - 5 RBC/hpf   WBC, UA 0-5 0 - 5 WBC/hpf   Bacteria, UA RARE (A) NONE SEEN   Squamous Epithelial / LPF 0-5 0 - 5    Imaging Studies: CT Head Wo Contrast  Result Date: 10/10/2022 CLINICAL DATA:  Initial evaluation for acute mental status change, unknown cause. EXAM: CT HEAD WITHOUT CONTRAST TECHNIQUE: Contiguous axial images were obtained from the base of the skull through the vertex without intravenous contrast. RADIATION DOSE REDUCTION: This exam was performed according to the departmental dose-optimization program which includes automated exposure control, adjustment of the mA and/or kV according to patient size and/or use of iterative reconstruction technique. COMPARISON:  None Available. FINDINGS: Brain: Generalized age-related cerebral atrophy with mild chronic small vessel ischemic disease. No acute intracranial hemorrhage. No acute large vessel territory infarct. No mass lesion, midline shift or mass effect. No hydrocephalus or extra-axial fluid collection. Vascular: No abnormal hyperdense vessel. Scattered vascular calcifications noted within the carotid siphons. Skull: Scalp soft tissues and calvarium demonstrate no acute finding. Sinuses/Orbits: Globes are soft tissues within normal limits.  Visualized paranasal sinuses are clear. No mastoid effusion. Other: None. IMPRESSION: 1. No acute intracranial abnormality. 2. Generalized age-related cerebral atrophy with mild chronic small vessel ischemic disease. Electronically Signed   By: Jeannine Boga M.D.   On: 10/10/2022 19:32   CT ABDOMEN PELVIS WO CONTRAST  Result Date: 10/10/2022 CLINICAL DATA:  Nausea, vomiting, fever EXAM: CT ABDOMEN AND PELVIS WITHOUT CONTRAST TECHNIQUE: Multidetector CT imaging of the abdomen and pelvis was performed following the standard protocol without IV contrast. RADIATION DOSE REDUCTION: This exam was performed according to the departmental dose-optimization program which includes automated exposure control, adjustment of the mA and/or kV according to patient size and/or use of iterative reconstruction technique. COMPARISON:  None Available. FINDINGS: Lower chest: No acute abnormality Hepatobiliary: No focal liver abnormality is seen. Status post cholecystectomy. No biliary dilatation. Pancreas: No focal abnormality or ductal dilatation. Spleen: No focal abnormality.  Normal size. Adrenals/Urinary Tract: No adrenal abnormality. No focal renal abnormality. No stones or hydronephrosis. Urinary bladder is unremarkable. Stomach/Bowel: Left colonic diverticulosis. No active diverticulitis. Stomach and small bowel decompressed. No bowel obstruction. Vascular/Lymphatic: Aortic atherosclerosis. No evidence of aneurysm or adenopathy. Reproductive: Prior hysterectomy.  No adnexal masses. Other: No free fluid or free air. Musculoskeletal: No acute bony abnormality. IMPRESSION: No acute findings in the abdomen or pelvis. Left colonic diverticulosis. Aortic atherosclerosis. Electronically Signed   By: Rolm Baptise M.D.   On: 10/10/2022 19:31    2:22 AM Patient able to produce urine specimen after additional IV fluids.  No evidence of urinary tract infection.  Patient states she feels much better and is ready to go home.      Jenifer Struve, Jenny Reichmann, MD 10/11/22 [phone]

## 2022-10-11 NOTE — Patient Outreach (Signed)
  Care Coordination   Initial Visit Note   10/11/2022 Name: Heather Miles MRN: [id number] DOB: 1945/12/08  Heather Miles is a 76 y.o. year old female who sees Medina-Vargas, [name] C, NP for primary care. I spoke with  Heather Cocking Flannagan's son, Heather Miles, by phone today.  What matters to the patients health and wellness today?  Strengthen Support System and In-Home Support    Goals Addressed             This Visit's Progress    Strengthen Support Services   On track    Care Coordination Interventions: Solution-Focused Strategies employed:  Active listening / Reflection utilized  Emotional Support Provided Caregiver stress acknowledged  Consideration of in-home help encouraged : options discussed Verbalization of feelings encouraged  LCSW spoke with pt's step-son, Heather Miles Family and friends are concerned about decrease in pt's cognitive functioning and is interested in supportive resources to assist with daily activities, in addition, to med management LCSW discussed local programs and eligibility requirements LCSW will email resources for local home care agencies and assisted living facilities. Educational information regarding placement and levels of care also provided Family are highly interested in referral to Neurologist. LCSW will send in-basket message to PCP and follow up with referral coordinator, if needed Patient receives strong support from sons         SDOH assessments and interventions completed:  Yes  SDOH Interventions Today    Flowsheet Row Most Recent Value  SDOH Interventions   Food Insecurity Interventions Intervention Not Indicated  Housing Interventions Intervention Not Indicated  Transportation Interventions Intervention Not Indicated        Care Coordination Interventions:  Yes, provided   Follow up plan: Follow up call scheduled for 2-4 weeks    Encounter Outcome:  Pt. Visit Completed   Jenel Lucks, MSW, LCSW [facility] Care Management [facility]  [facility] [email or web address] Phone [phone] 12:36 PM

## 2022-10-11 NOTE — Patient Instructions (Signed)
Visit Information  Thank you for taking time to visit with me today. Please don't hesitate to contact me if I can be of assistance to you.   Following are the goals we discussed today:   Goals Addressed             This Visit's Progress    Strengthen Support Services   On track    Care Coordination Interventions: Solution-Focused Strategies employed:  Active listening / Reflection utilized  Emotional Support Provided Caregiver stress acknowledged  Consideration of in-home help encouraged : options discussed Verbalization of feelings encouraged  LCSW spoke with pt's step-son, Trey Paula Family and friends are concerned about decrease in pt's cognitive functioning and is interested in supportive resources to assist with daily activities, in addition, to med management LCSW discussed local programs and eligibility requirements LCSW will email resources for local home care agencies and assisted living facilities. Educational information regarding placement and levels of care also provided Family are highly interested in referral to Neurologist. LCSW will send in-basket message to PCP and follow up with referral coordinator, if needed Patient receives strong support from sons         Our next appointment is by telephone on 10/23/22 at 1 PM  Please call the care guide team at [phone] if you need to cancel or reschedule your appointment.   If you are experiencing a Mental Health or Behavioral Health Crisis or need someone to talk to, please call the Suicide and Crisis Lifeline: 988 call 911   Patient verbalizes understanding of instructions and care plan provided today and agrees to view in MyChart. Active MyChart status and patient understanding of how to access instructions and care plan via MyChart confirmed with patient.     Jenel Lucks, MSW, LCSW [facility] Care Management [place]  [facility] [email or web address] Phone [phone] 12:37 PM

## 2022-10-14 ENCOUNTER — Telehealth: Payer: Self-pay | Admitting: Adult Health

## 2022-10-14 ENCOUNTER — Other Ambulatory Visit: Payer: Self-pay | Admitting: Adult Health

## 2022-10-14 ENCOUNTER — Telehealth: Payer: Self-pay | Admitting: Licensed Clinical Social Worker

## 2022-10-14 DIAGNOSIS — F09 Unspecified mental disorder due to known physiological condition: Secondary | ICD-10-CM

## 2022-10-14 NOTE — Patient Outreach (Signed)
  Care Coordination   Follow Up Visit Note   10/14/2022 Name: Heather Miles MRN: [id number] DOB: 1945/12/11  Heather Miles is a 76 y.o. year old female who sees Medina-Vargas, [name] C, NP for primary care. I spoke with  Heather Miles's son, Trey Paula, by phone today.  What matters to the patients health and wellness today?  Trey Paula was unavailable and agreed to follow up with LCSW at a later time     SDOH assessments and interventions completed:  No     Care Coordination Interventions:  Yes, provided   Follow up plan:  Trey Paula agreed to return call later today, 10/14/22    Encounter Outcome:  Pt. Request to Call Back   Jenel Lucks, MSW, LCSW [facility] Care Management [facility]  [facility] [email or web address] Phone [phone] 11:45 AM

## 2022-10-14 NOTE — Telephone Encounter (Signed)
Patient's son, Trey Paula, called and said he spoke to social worker, Jenel Lucks, who was going to reach out to you to refer his mom to a neurologist. Heather Miles is now forgetting to eat and sometimes doesn't answer the door. Her car keys have been taken away and all alcohol has been removed from the home. Trey Paula has home health coming out twice a day to feed her but she forgets to eat. Trey Paula would like mom referred to a neurologist as soon as possible.

## 2022-10-15 ENCOUNTER — Telehealth: Payer: Self-pay | Admitting: Licensed Clinical Social Worker

## 2022-10-15 NOTE — Patient Instructions (Signed)
Visit Information  Thank you for taking time to visit with me today. Please don't hesitate to contact me if I can be of assistance to you.   Following are the goals we discussed today:   Goals Addressed             This Visit's Progress    Strengthen Support Services   On track    Care Coordination Interventions: Solution-Focused Strategies employed:  Active listening / Reflection utilized  Emotional Support Provided Caregiver stress acknowledged  Consideration of in-home help encouraged : options discussed Verbalization of feelings encouraged  Pt visited ED after family voiced concerns about pt's well-being noting a decrease in cognitive functioning within 2 weeks, per son. Patient has been observed forgetting to eat, not changing clothes, or sleeping in bedroom. Patient has a walker; however, forgets to utilize it resulting in fall risk Family has hired a Engineer, civil (consulting) to assist and Elesa Hacker continues to check on her. Family has decided to not allow pt to drive due to ongoing concerns Son is requesting a neurology referral from PCP. LCSW informed Research officer, trade union, Clydie Braun, of family's request to be reviewed by PCP No additional immediate concerns noted         Our next appointment is by telephone on 10/23/22 at 1 PM  Please call the care guide team at [phone] if you need to cancel or reschedule your appointment.   If you are experiencing a Mental Health or Behavioral Health Crisis or need someone to talk to, please call the Suicide and Crisis Lifeline: 988 call 911   Patient verbalizes understanding of instructions and care plan provided today and agrees to view in MyChart. Active MyChart status and patient understanding of how to access instructions and care plan via MyChart confirmed with patient.     Jenel Lucks, MSW, LCSW [facility] Care Management [place]  [facility] [email or web address] Phone [phone] 5:38 PM

## 2022-10-15 NOTE — Patient Outreach (Signed)
  Care Coordination Late Entry/Documentation  Follow Up Visit Note   Outreach completed on 10/14/22 Name: Heather Miles MRN: [id number] DOB: 1946/03/07  Heather Miles is a 76 y.o. year old female who sees Medina-Vargas, [name] C, NP for primary care. I spoke with  Heather Cocking Friscia's son, Heather Miles, by phone today.  What matters to the patients health and wellness today?  Neurology referral    Goals Addressed             This Visit's Progress    Strengthen Support Services   On track    Care Coordination Interventions: Solution-Focused Strategies employed:  Active listening / Reflection utilized  Emotional Support Provided Caregiver stress acknowledged  Consideration of in-home help encouraged : options discussed Verbalization of feelings encouraged  Pt visited ED after family voiced concerns about pt's well-being noting a decrease in cognitive functioning within 2 weeks, per son. Patient has been observed forgetting to eat, not changing clothes, or sleeping in bedroom. Patient has a walker; however, forgets to utilize it resulting in fall risk Family has hired a Engineer, civil (consulting) to assist and Elesa Hacker continues to check on her. Family has decided to not allow pt to drive due to ongoing concerns Son is requesting a neurology referral from PCP. LCSW informed Research officer, trade union, Clydie Braun, of family's request to be reviewed by PCP No additional immediate concerns noted         SDOH assessments and interventions completed:  No     Care Coordination Interventions:  Yes, provided   Follow up plan: Follow up call scheduled for 2-4 weeks    Encounter Outcome:  Pt. Visit Completed   Jenel Lucks, MSW, LCSW [facility] Care Management [facility] Health  [facility] [email or web address] Phone [phone] 5:38 PM

## 2022-10-17 ENCOUNTER — Encounter: Payer: Self-pay | Admitting: Neurology

## 2022-10-17 ENCOUNTER — Ambulatory Visit: Payer: Medicare PPO | Admitting: Neurology

## 2022-10-17 VITALS — BP 114/67 | HR 92 | Ht 62.0 in | Wt 126.0 lb

## 2022-10-17 DIAGNOSIS — E785 Hyperlipidemia, unspecified: Secondary | ICD-10-CM

## 2022-10-17 DIAGNOSIS — F03B0 Unspecified dementia, moderate, without behavioral disturbance, psychotic disturbance, mood disturbance, and anxiety: Secondary | ICD-10-CM | POA: Diagnosis not present

## 2022-10-17 DIAGNOSIS — G309 Alzheimer's disease, unspecified: Secondary | ICD-10-CM | POA: Diagnosis not present

## 2022-10-17 DIAGNOSIS — I10 Essential (primary) hypertension: Secondary | ICD-10-CM | POA: Diagnosis not present

## 2022-10-17 DIAGNOSIS — R269 Unspecified abnormalities of gait and mobility: Secondary | ICD-10-CM | POA: Diagnosis not present

## 2022-10-17 NOTE — Progress Notes (Signed)
[facility]  PATIENT: Heather Miles DOB: 11/03/1946  REFERRING DOCTOR OR PCP: Yolanda Bonine, NP SOURCE: Patient, notes from primary care,  _________________________________   HISTORICAL  CHIEF COMPLAINT:  Chief Complaint  Patient presents with   Follow-up    Pt in room #10 with son. Pt here today to discuss her memory loss.    HISTORY OF PRESENT ILLNESS:  I had the pleasure seeing your patient, Heather Miles, at [facility] for neurologic consultation regarding her memory loss.  She is a 76 year old woman who has had progressive cognitive impairment that began a couple years ago but has become much worse over the last couple months.  Her husband of 40 years passed away in 03/03/20.  He had noted mild cognitive issues before his passing.   Their sons have noted mild worsening the past 2 years.  However, she continued to do well living on her own..   In August 2023, her family noted her to have confusion and she was taken to primary care.  MMSE was 21/30.  She was started on donepezil but only took a few pills.   On 08/08/2022, she saw her primary care again.  MMSE was 25/30 which was improved compared to 21/30 on 07/04/2022.      Around Thanksgiving, she was doing much worse.   Her son who lives out of town called but she did not answer.  The next day, he drove over and noted she was much worse.  Specifically, she had confusion and reduced memory compared to just a month earlier..  Her son, Trey Paula, discussed further with primary care and some additional services were requested with [facility] Care management.  Due to further worsening, she presented to the emergency room 10/10/2022 besides confusion, she also had nausea and vomiting..  Laboratory test was consistent with dehydration with acute kidney injury.  She was hydrated.  CT scan of the head was performed which showed mild atrophy and chronic microvascular ischemic changes but no acute  findings.  CT of the abdomen did not show any acute findings.  A nurse was hired to help her  Last night her son came to see her (in preparation for today's visit).   She did not know the son's name.  She was hallucinatong.  She did not know her husband was deceased.   She was looking for her dad.   She did not recognize her bedroom.     Today, she thinks her son, Trey Paula, was her brother not son.       10/17/2022    8:37 AM 08/08/2022    1:18 PM 07/04/2022    1:17 PM  MMSE - Mini Mental State Exam  Not completed:   Refused  Orientation to time 2 4 3   Orientation to Place 2 5 5   Registration 3 3 3   Attention/ Calculation 0 5 1  Recall 0 0 0  Language- name 2 objects 2 2 2   Language- repeat 1 1 1   Language- follow 3 step command 3 3 3   Language- read & follow direction 1 1 1   Write a sentence 1 0 1  Copy design 1 1 1   Total score 16 25 21     She feels she is walking well.   She denies falls.    However, the son notes gait is much worse.   She needs help to stand up.  She is shuffling.   Although she had some gait issues earlier this year  the current problems are significantly worse.  Vacular risks:   HTN, hypercholerolemia.    She denies a FH of dementia.   Imaging: CT scan of the head 10/10/2022 shows mild generalized cortical atrophy.  There was no lobar specific atrophy.  Additionally there are some foci of hypoattenuation consistent with chronic microvascular ischemic change.   Laboratory: 10/10/2022: She had a metabolic acidosis and mild acute renal failure (creatinine 1.73, BUN 54, GFR 30).  Later that day after fluids creatinine/BUN was 1.21/47 and the anion gap had reduced from 24-18.Marland Kitchen.  Ketones were in the urine.  CBC was normal.  06/17/2022: Thyroid function was normal.  Vitamin D was slightly low at 27.  Hemoglobin A1c was borderline at 5.6.   REVIEW OF SYSTEMS: Constitutional: No fevers, chills, sweats, or change in appetite Eyes: No visual changes, double vision, eye  pain Ear, nose and throat: No hearing loss, ear pain, nasal congestion, sore throat Cardiovascular: No chest pain, palpitations Respiratory:  No shortness of breath at rest or with exertion.   No wheezes GastrointestinaI: No nausea, vomiting, diarrhea, abdominal pain, fecal incontinence Genitourinary:  No dysuria, urinary retention or frequency.  No nocturia. Musculoskeletal:  No neck pain, back pain Integumentary: No rash, pruritus, skin lesions Neurological: as above Psychiatric: No depression at this time.  No anxiety Endocrine: No palpitations, diaphoresis, change in appetite, change in weigh or increased thirst Hematologic/Lymphatic:  No anemia, purpura, petechiae. Allergic/Immunologic: No itchy/runny eyes, nasal congestion, recent allergic reactions, rashes  ALLERGIES: Allergies  Allergen Reactions   Iodine    Statins     Muscle soreness and loss of memory    HOME MEDICATIONS:  Current Outpatient Medications:    azelastine (ASTELIN) 0.1 % nasal spray, Place 1 spray into both nostrils at bedtime. Use in each nostril as directed, Disp: , Rfl:    Calcium Carbonate-Vit D-Min (CALTRATE 600+D PLUS) 600-400 MG-UNIT per tablet, Take 1 tablet by mouth daily., Disp: , Rfl:    cholecalciferol (VITAMIN D) 1000 UNITS tablet, Take 1,000 Units by mouth daily., Disp: , Rfl:    donepezil (ARICEPT) 5 MG tablet, Take 1 tablet (5 mg total) by mouth at bedtime., Disp: 30 tablet, Rfl: 3   fexofenadine (ALLEGRA) 180 MG tablet, Take 180 mg by mouth daily., Disp: , Rfl:    fluticasone (FLONASE) 50 MCG/ACT nasal spray, Place 2 sprays into both nostrils 2 (two) times daily., Disp: , Rfl:    latanoprost (XALATAN) 0.005 % ophthalmic solution, Place 1 drop into the left eye daily. , Disp: , Rfl:    lisinopril (ZESTRIL) 20 MG tablet, Take 1 tablet by mouth once daily, Disp: 90 tablet, Rfl: 0   lovastatin (MEVACOR) 40 MG tablet, TAKE 1 TABLET BY MOUTH ONCE DAILY . APPOINTMENT REQUIRED FOR FUTURE REFILLS,  Disp: 30 tablet, Rfl: 6   multivitamin (THERAGRAN) per tablet, Take 1 tablet by mouth daily., Disp: , Rfl:    Saccharomyces boulardii (PROBIOTIC) 250 MG CAPS, Take 1 capsule by mouth daily., Disp: , Rfl:   PAST MEDICAL HISTORY: Past Medical History:  Diagnosis Date   Murmur    Other and unspecified hyperlipidemia    Unspecified essential hypertension     PAST SURGICAL HISTORY: Past Surgical History:  Procedure Laterality Date   TONSILLECTOMY      FAMILY HISTORY: History reviewed. No pertinent family history.  SOCIAL HISTORY: Social History   Socioeconomic History   Marital status: Married    Spouse name: Not on file   Number of children: Not on  file   Years of education: Not on file   Highest education level: Not on file  Occupational History   Occupation: retired    Associate Professor: OTHER    Comment: Oncologist at [employer]  Tobacco Use   Smoking status: Never   Smokeless tobacco: Never  Vaping Use   Vaping Use: Never used  Substance and Sexual Activity   Alcohol use: No   Drug use: No   Sexual activity: Not on file  Other Topics Concern   Not on file  Social History Narrative   Not on file   Social Determinants of Health   Financial Resource Strain: Not on file  Food Insecurity: No Food Insecurity (10/09/2022)   Hunger Vital Sign    Worried About Running Out of Food in the Last Year: Never true    Ran Out of Food in the Last Year: Never true  Transportation Needs: No Transportation Needs (10/09/2022)   PRAPARE - Administrator, Civil Service (Medical): No    Lack of Transportation (Non-Medical): No  Physical Activity: Not on file  Stress: Not on file  Social Connections: Not on file  Intimate Partner Violence: Not on file       PHYSICAL EXAM  Vitals:   10/17/22 0826  BP: 114/67  Pulse: 92  Weight: 126 lb (57.2 kg)  Height: 5\' 2"  (1.575 m)    Body mass index is 23.05 kg/m.   General: The patient is well-developed and  well-nourished and in no acute distress  HEENT:  Head is [place]/AT.  Sclera are anicteric.     Neck: No carotid bruits are noted.  The neck is nontender.  Cardiovascular: The heart has a regular rate and rhythm with a normal S1 and S2.  There was a soft systolic ejection murmur.  There were no gallops or rubs.    Skin: Extremities are without rash or  edema.  Musculoskeletal:  Back is nontender  Neurologic Exam  Mental status: The patient is alert and oriented x 1 at the time of the examination.  She had poor recall.  Attention was reduced.  Speech is normal.  Cranial nerves: Extraocular movements are full. Pupils are equal, round, and reactive to light and accomodation.  Visual fields are full.  Facial symmetry is present. There is good facial sensation to soft touch bilaterally.Facial strength is normal.  Trapezius and sternocleidomastoid strength is normal. No dysarthria is noted.  The tongue is midline, and the patient has symmetric elevation of the soft palate. No obvious hearing deficits are noted.  Motor:  Muscle bulk is normal.   Tone is normal. Strength is  5 / 5 in all 4 extremities.   Sensory: Sensory testing is intact to pinprick, soft touch and vibration sensation in all 4 extremities.  Coordination: Cerebellar testing reveals good finger-nose-finger and heel-to-shin bilaterally.  Gait and station: She needed to use 1 arm to stand up.  Once up she was stable.  The gait was wide and shuffling.  There is retropulsion.  Romberg was borderline.  Reflexes: Deep tendon reflexes are symmetric and normal bilaterally.   Plantar responses are flexor.  She had a palmomental reflex    DIAGNOSTIC DATA (LABS, IMAGING, TESTING) - I reviewed patient records, labs, notes, testing and imaging myself where available.  Lab Results  Component Value Date   WBC 8.5 10/10/2022   HGB 14.3 10/10/2022   HCT 41.1 10/10/2022   MCV 98.1 10/10/2022   PLT 299 10/10/2022  Component Value  Date/Time   NA 142 10/10/2022 2256   NA 135 04/13/2018 0933   K 3.5 10/10/2022 2256   CL 104 10/10/2022 2256   CO2 20 (L) 10/10/2022 2256   GLUCOSE 125 (H) 10/10/2022 2256   BUN 47 (H) 10/10/2022 2256   BUN 11 04/13/2018 0933   CREATININE 1.21 (H) 10/10/2022 2256   CALCIUM 8.2 (L) 10/10/2022 2256   PROT 8.0 10/10/2022 1637   PROT 7.1 04/13/2018 0933   ALBUMIN 4.9 10/10/2022 1637   ALBUMIN 4.7 04/13/2018 0933   AST 30 10/10/2022 1637   ALT 35 10/10/2022 1637   ALKPHOS 57 10/10/2022 1637   BILITOT 0.6 10/10/2022 1637   BILITOT <0.2 04/13/2018 0933   GFRNONAA 46 (L) 10/10/2022 2256   GFRAA 100 04/13/2018 0933       ASSESSMENT AND PLAN  Moderate dementia, unspecified dementia type, unspecified whether behavioral, psychotic, or mood disturbance or anxiety (HCC) - Plan: Vitamin B12, Comprehensive metabolic panel, CBC with Differential/Platelet, ATN PROFILE  Alzheimer's disease, unspecified (CODE) (HCC) - Plan: Vitamin B12, Comprehensive metabolic panel, CBC with Differential/Platelet, MR BRAIN WO CONTRAST, ATN PROFILE  Gait disturbance  Dyslipidemia  Essential hypertension   In summary, Ms. Holladay is a 76 year old woman who has had mild progressive cognitive changes over the last couple years that have accelerated over the last few months.  Additionally she has gait disturbance that has worsened.  Currently, she she is confused and sometimes has mild nonthreatening visual hallucinations.  CT scan showed mild generalized cortical atrophy and some chronic microvascular ischemic change.  Of note, the atrophy was not worse in the medial temporal lobes.  On exam, besides the cognitive changes (scored 16/30 on the MMSE today) she had a shuffling gait with retropulsion.  Her dementia is not neatly fitting into any category so we will need to do some additional testing.  We will check a noncontrasted MRI of the brain to determine if some of the more recent cognitive and gait changes could  be due to a reversible etiology as well as to determine the extent of vascular changes.  If gait changes continue to progress, we may need to also check cervical spine MRI in the future Check lab work including amyloid beta 42/40 ratio and [id number].  If these Alzheimer markers are both abnormal, then Alzheimer's is probable.  Additionally we will check vitamin B12. She is currently living alone with a nurse that comes in a few hours a day.  I had a discussion with her son.  She might do best in a memory unit type of skilled nursing facility and the family is going to look into places closer to one of the sons homes (1 lives in [place] and 1 lives in [place]) I did fill out an [place] 2 form today. Return in 3 months or sooner if there are new or worsening neurologic symptoms or based on the findings of the studies    To go over results and to schedule test please call her son Trey Paula:  [phone]  75-minute office visit with the majority of the time spent face-to-face for history and physical, discussion/counseling and decision-making.  Additional time with record review and documentation.    Dontasia Miranda A. Epimenio Foot, MD, [facility] 10/17/2022, 8:30 AM Certified in Neurology, Clinical Neurophysiology, Sleep Medicine and Neuroimaging  [facility] [address] [place], [place] [address] [phone]

## 2022-10-18 ENCOUNTER — Encounter: Payer: Self-pay | Admitting: Neurology

## 2022-10-21 ENCOUNTER — Telehealth: Payer: Self-pay | Admitting: Neurology

## 2022-10-21 NOTE — Telephone Encounter (Signed)
Heather Miles: [id number] exp. 10/21/22-11/20/22 sent to GI [phone]

## 2022-10-22 LAB — CBC WITH DIFFERENTIAL/PLATELET
Basophils Absolute: 0 10*3/uL (ref 0.0–0.2)
Basos: 1 %
EOS (ABSOLUTE): 0.1 10*3/uL (ref 0.0–0.4)
Eos: 2 %
Hematocrit: 31.8 % — ABNORMAL LOW (ref 34.0–46.6)
Hemoglobin: 10.6 g/dL — ABNORMAL LOW (ref 11.1–15.9)
Immature Grans (Abs): 0 10*3/uL (ref 0.0–0.1)
Immature Granulocytes: 1 %
Lymphocytes Absolute: 1.3 10*3/uL (ref 0.7–3.1)
Lymphs: 24 %
MCH: 33.1 pg — ABNORMAL HIGH (ref 26.6–33.0)
MCHC: 33.3 g/dL (ref 31.5–35.7)
MCV: 99 fL — ABNORMAL HIGH (ref 79–97)
Monocytes Absolute: 0.5 10*3/uL (ref 0.1–0.9)
Monocytes: 10 %
Neutrophils Absolute: 3.3 10*3/uL (ref 1.4–7.0)
Neutrophils: 62 %
Platelets: 210 10*3/uL (ref 150–450)
RBC: 3.2 x10E6/uL — ABNORMAL LOW (ref 3.77–5.28)
RDW: 12 % (ref 11.7–15.4)
WBC: 5.2 10*3/uL (ref 3.4–10.8)

## 2022-10-22 LAB — ATN PROFILE
A -- Beta-amyloid 42/40 Ratio: 0.094 — ABNORMAL LOW (ref >0.102)
Beta-amyloid 40: 244.01 pg/mL
Beta-amyloid 42: 23.04 pg/mL
N -- NfL, Plasma: 52 pg/mL — ABNORMAL HIGH (ref 0.00–7.64)
T -- p-tau181: 2.38 pg/mL — ABNORMAL HIGH (ref 0.00–0.97)

## 2022-10-22 LAB — COMPREHENSIVE METABOLIC PANEL
ALT: 31 IU/L (ref 0–32)
AST: 33 IU/L (ref 0–40)
Albumin/Globulin Ratio: 1.7 (ref 1.2–2.2)
Albumin: 3.6 g/dL — ABNORMAL LOW (ref 3.8–4.8)
Alkaline Phosphatase: 49 IU/L (ref 44–121)
BUN/Creatinine Ratio: 25 (ref 12–28)
BUN: 15 mg/dL (ref 8–27)
Bilirubin Total: 0.2 mg/dL (ref 0.0–1.2)
CO2: 30 mmol/L — ABNORMAL HIGH (ref 20–29)
Calcium: 9.7 mg/dL (ref 8.7–10.3)
Chloride: 100 mmol/L (ref 96–106)
Creatinine, Ser: 0.6 mg/dL (ref 0.57–1.00)
Globulin, Total: 2.1 g/dL (ref 1.5–4.5)
Glucose: 141 mg/dL — ABNORMAL HIGH (ref 70–99)
Potassium: 3.5 mmol/L (ref 3.5–5.2)
Sodium: 143 mmol/L (ref 134–144)
Total Protein: 5.7 g/dL — ABNORMAL LOW (ref 6.0–8.5)
eGFR: 92 mL/min/{1.73_m2} (ref >59)

## 2022-10-22 LAB — VITAMIN B12: Vitamin B-12: 931 pg/mL (ref 232–1245)

## 2022-10-23 ENCOUNTER — Encounter: Payer: Self-pay | Admitting: Adult Health

## 2022-10-23 ENCOUNTER — Ambulatory Visit: Payer: Self-pay | Admitting: Licensed Clinical Social Worker

## 2022-10-24 ENCOUNTER — Other Ambulatory Visit: Payer: Self-pay | Admitting: Adult Health

## 2022-10-24 DIAGNOSIS — F09 Unspecified mental disorder due to known physiological condition: Secondary | ICD-10-CM

## 2022-10-24 MED ORDER — LOVASTATIN 40 MG PO TABS: ORAL_TABLET | ORAL | 6 refills | Status: DC

## 2022-10-24 MED ORDER — DONEPEZIL HCL 5 MG PO TABS: 5.0000 mg | ORAL_TABLET | Freq: Every day | ORAL | 3 refills | Status: DC

## 2022-10-25 NOTE — Patient Instructions (Signed)
Visit Information  Thank you for taking time to visit with me today. Please don't hesitate to contact me if I can be of assistance to you.   Following are the goals we discussed today:   Goals Addressed             This Visit's Progress    Strengthen Support Services   On track    Care Coordination Interventions: Solution-Focused Strategies employed:  Active listening / Reflection utilized  Emotional Support Provided Caregiver stress acknowledged  Consideration of in-home help encouraged : options discussed Verbalization of feelings encouraged  Pt's son, Trey Paula, informed LCSW of pt's diagnoses from recent Neurologist appt. Family are waiting on a call to schedule an MRI Family are looking into multiple memory care facilities closer to them. They have been provided an FL2 and was informed that it will expire in 30 days Patient has multiple nurses in her current residence to assist with supervision and medical needs. Her health has declined significantly within the last couple of weeks, noting that she cannot recall family currently LCSW inquired about patient experience due to difficulties family experienced advocating for referrals and services in October 2023. Family are interested in completing written documentation, to then be submitted. LCSW will collaborate with leadership about form and provide to family via email         Our next appointment is by telephone on 11/08/22 at 1 PM  Please call the care guide team at [phone] if you need to cancel or reschedule your appointment.   If you are experiencing a Mental Health or Behavioral Health Crisis or need someone to talk to, please call the Suicide and Crisis Lifeline: 988 call 911   Patient verbalizes understanding of instructions and care plan provided today and agrees to view in MyChart. Active MyChart status and patient understanding of how to access instructions and care plan via MyChart confirmed with patient.      Jenel Lucks, MSW, LCSW [facility] Care Management [place]  [facility] [email or web address] Phone [phone] 8:31 AM

## 2022-10-25 NOTE — Patient Outreach (Signed)
  Care Coordination Late Entry/Documentation  Follow Up Visit Note   Outreach completed 10/23/22 Name: Heather Miles MRN: [id number] DOB: 12-Oct-1946  Heather Miles is a 76 y.o. year old female who sees Medina-Vargas, [name] C, NP for primary care. I spoke with  Heather Miles's son, Heather Miles, by phone today.  What matters to the patients health and wellness today?  Placement to Memory Care Facility    Goals Addressed             This Visit's Progress    Strengthen Support Services   On track    Care Coordination Interventions: Solution-Focused Strategies employed:  Active listening / Reflection utilized  Emotional Support Provided Caregiver stress acknowledged  Consideration of in-home help encouraged : options discussed Verbalization of feelings encouraged  Pt's son, Heather Miles, informed LCSW of pt's diagnoses from recent Neurologist appt. Family are waiting on a call to schedule an MRI Family are looking into multiple memory care facilities closer to them. They have been provided an FL2 and was informed that it will expire in 30 days Patient has multiple nurses in her current residence to assist with supervision and medical needs. Her health has declined significantly within the last couple of weeks, noting that she cannot recall family currently LCSW inquired about patient experience due to difficulties family experienced advocating for referrals and services in October 2023. Family are interested in completing written documentation, to then be submitted. LCSW will collaborate with leadership about form and provide to family via email         SDOH assessments and interventions completed:  No     Care Coordination Interventions:  Yes, provided   Follow up plan: Follow up call scheduled for 2-4 weeks    Encounter Outcome:  Pt. Visit Completed   Jenel Lucks, MSW, LCSW [facility] Care Management [facility] Health  [facility] [email or web address] Phone [phone] 8:30  AM

## 2022-10-29 ENCOUNTER — Other Ambulatory Visit: Payer: Self-pay | Admitting: *Deleted

## 2022-10-29 ENCOUNTER — Other Ambulatory Visit: Payer: Self-pay | Admitting: Neurology

## 2022-10-29 DIAGNOSIS — F09 Unspecified mental disorder due to known physiological condition: Secondary | ICD-10-CM

## 2022-10-29 MED ORDER — DONEPEZIL HCL 5 MG PO TABS: 5.0000 mg | ORAL_TABLET | Freq: Every day | ORAL | 3 refills | Status: AC

## 2022-10-29 MED ORDER — LISINOPRIL 20 MG PO TABS: 20.0000 mg | ORAL_TABLET | Freq: Every day | ORAL | 1 refills | Status: AC

## 2022-10-29 MED ORDER — LOVASTATIN 40 MG PO TABS: ORAL_TABLET | ORAL | 1 refills | Status: DC

## 2022-10-30 ENCOUNTER — Telehealth: Payer: Self-pay | Admitting: Neurology

## 2022-10-30 NOTE — Telephone Encounter (Signed)
Pt son is calling again. Stated he just wanted to speak with nurse because he just uploaded the form that needs to be signed tomorrow by noon.

## 2022-10-30 NOTE — Telephone Encounter (Signed)
Pt son Trey Paula is calling. Said FL2 was wrong and he has the right form and he is going to try to upload it to [employer]. Stated it needs to be signed by noon tomorrow if not pt will not be allow to enter memory care center. Trey Paula is requesting a call back.

## 2022-10-30 NOTE — Telephone Encounter (Signed)
Heather Miles, can you help get the correct form for [place] to complete? Thank you. Dr. Epimenio Foot and I are off tomorrow

## 2022-10-30 NOTE — Telephone Encounter (Signed)
He also sent mychart. I replied to [employer]. Updated form emailed to him

## 2022-10-31 NOTE — Telephone Encounter (Signed)
Per patient request the order is being sent to [facility] Office: [address] [place], [place] [address]  Tel: [phone]

## 2022-11-05 DIAGNOSIS — F0390 Unspecified dementia without behavioral disturbance: Secondary | ICD-10-CM | POA: Diagnosis not present

## 2022-11-05 DIAGNOSIS — I10 Essential (primary) hypertension: Secondary | ICD-10-CM | POA: Diagnosis not present

## 2022-11-05 DIAGNOSIS — E785 Hyperlipidemia, unspecified: Secondary | ICD-10-CM | POA: Diagnosis not present

## 2022-11-05 DIAGNOSIS — E559 Vitamin D deficiency, unspecified: Secondary | ICD-10-CM | POA: Diagnosis not present

## 2022-11-08 ENCOUNTER — Ambulatory Visit: Payer: Self-pay | Admitting: Licensed Clinical Social Worker

## 2022-11-10 DIAGNOSIS — F339 Major depressive disorder, recurrent, unspecified: Secondary | ICD-10-CM | POA: Diagnosis not present

## 2022-11-11 DIAGNOSIS — F411 Generalized anxiety disorder: Secondary | ICD-10-CM | POA: Diagnosis not present

## 2022-11-11 DIAGNOSIS — F339 Major depressive disorder, recurrent, unspecified: Secondary | ICD-10-CM | POA: Diagnosis not present

## 2022-11-12 DIAGNOSIS — E785 Hyperlipidemia, unspecified: Secondary | ICD-10-CM | POA: Diagnosis not present

## 2022-11-12 DIAGNOSIS — E559 Vitamin D deficiency, unspecified: Secondary | ICD-10-CM | POA: Diagnosis not present

## 2022-11-12 DIAGNOSIS — I10 Essential (primary) hypertension: Secondary | ICD-10-CM | POA: Diagnosis not present

## 2022-11-12 NOTE — Patient Outreach (Signed)
  Care Coordination Late Entry  Follow Up Visit Note   Outreach completed on 11/08/22 Name: SAHER DAVEE MRN: [id number] DOB: [birth year]  Thalia Party Trethewey is a 77 y.o. year old female who sees Medina-Vargas, [name] C, NP for primary care. I spoke with  Thalia Party Linehan's sons, [name] and Gerald Stabs, by phone today.  What matters to the patients health and wellness today?  Care Coordination    Goals Addressed             This Visit's Progress    COMPLETED: Strengthen Support Services   On track    Care Coordination Interventions: Solution-Focused Strategies employed:  Active listening / Reflection utilized  Emotional Support Provided Caregiver stress acknowledged  Consideration of in-home help encouraged : options discussed Verbalization of feelings encouraged  Patient has moved to a memory care unit in [place], [place]. Provider at the facility will visit her Tuesday, Jan 9th Patient is doing well. She will be receiving PT to strengthen mobility. Family shared pt appears more alert and coherent, as of late LCSW obtained consent from family to provide their contact information to Patient Experience to discuss events with PCP office, regarding patient care No additional concerns/questions           SDOH assessments and interventions completed:  No     Care Coordination Interventions:  Yes, provided   Follow up plan: No further intervention required.   Encounter Outcome:  Pt. Visit Completed   Christa See, MSW, [email or web address] Phone [phone] 12:41 PM

## 2022-11-12 NOTE — Patient Instructions (Signed)
Visit Information  Thank you for taking time to visit with me today. Please don't hesitate to contact me if I can be of assistance to you.   Following are the goals we discussed today:   Goals Addressed             This Visit's Progress    COMPLETED: Personal assistant   On track    Care Coordination Interventions: Solution-Focused Strategies employed:  Active listening / Reflection utilized  Emotional Support Provided Caregiver stress acknowledged  Consideration of in-home help encouraged : options discussed Verbalization of feelings encouraged  Patient has moved to a memory care unit in [place], [place]. Provider at the facility will visit her Tuesday, Jan 9th Patient is doing well. She will be receiving PT to strengthen mobility. Family shared pt appears more alert and coherent, as of late LCSW obtained consent from family to provide their contact information to Patient Experience to discuss events with PCP office, regarding patient care No additional concerns/questions           If you are experiencing a Mental Health or [place] or need someone to talk to, please call the Suicide and Crisis Lifeline: 988 call 911   Patient verbalizes understanding of instructions and care plan provided today and agrees to view in [place]. Active MyChart status and patient understanding of how to access instructions and care plan via MyChart confirmed with patient.     No further follow up required:    Christa See, MSW, [email or web address] Phone [phone] 12:42 PM

## 2022-11-13 DIAGNOSIS — R2689 Other abnormalities of gait and mobility: Secondary | ICD-10-CM | POA: Diagnosis not present

## 2022-11-13 DIAGNOSIS — M6281 Muscle weakness (generalized): Secondary | ICD-10-CM | POA: Diagnosis not present

## 2022-11-13 DIAGNOSIS — R2681 Unsteadiness on feet: Secondary | ICD-10-CM | POA: Diagnosis not present

## 2022-11-14 ENCOUNTER — Ambulatory Visit: Payer: Medicare PPO | Admitting: Adult Health

## 2022-11-14 DIAGNOSIS — R2681 Unsteadiness on feet: Secondary | ICD-10-CM | POA: Diagnosis not present

## 2022-11-14 DIAGNOSIS — M6281 Muscle weakness (generalized): Secondary | ICD-10-CM | POA: Diagnosis not present

## 2022-11-14 DIAGNOSIS — R2689 Other abnormalities of gait and mobility: Secondary | ICD-10-CM | POA: Diagnosis not present

## 2022-11-15 ENCOUNTER — Telehealth: Payer: Self-pay | Admitting: Licensed Clinical Social Worker

## 2022-11-15 NOTE — Patient Outreach (Signed)
  Care Coordination   Follow Up Visit Note   11/15/2022 Name: Heather Miles MRN: [id number] DOB: 1946/09/10  Heather Miles is a 77 y.o. year old female who sees Medina-Vargas, [name] C, NP for primary care. I spoke with  Heather Miles's son, Heather Miles, by phone today.  What matters to the patients health and wellness today?  Patient Experience    Goals Addressed             This Visit's Progress    Speak with Patient Experience       Care Coordination Interventions: Solution-Focused Strategies employed: LCSW received call from pt's stepson, Heather Miles, who requested an update. He would like to speak with Patient Experience regarding coordination of care with PCP office LCSW forwarded Mr. Looper an email that the request was submitted via e-mail on Tuesday, January 9, 24 LCSW sent e-mail requesting contact to Heather Miles and Heather Miles         SDOH assessments and interventions completed:  No     Care Coordination Interventions:  Yes, provided   Follow up plan:  LCSW will update Mr. Fairhurst via e-mail, per request    Encounter Outcome:  Pt. Visit Completed   Christa See, MSW, [email or web address] Phone [phone] 4:24 PM

## 2022-11-18 DIAGNOSIS — R2681 Unsteadiness on feet: Secondary | ICD-10-CM | POA: Diagnosis not present

## 2022-11-18 DIAGNOSIS — M6281 Muscle weakness (generalized): Secondary | ICD-10-CM | POA: Diagnosis not present

## 2022-11-18 DIAGNOSIS — R2689 Other abnormalities of gait and mobility: Secondary | ICD-10-CM | POA: Diagnosis not present

## 2022-11-19 DIAGNOSIS — U071 COVID-19: Secondary | ICD-10-CM | POA: Diagnosis not present

## 2022-11-19 DIAGNOSIS — E559 Vitamin D deficiency, unspecified: Secondary | ICD-10-CM | POA: Diagnosis not present

## 2022-11-19 DIAGNOSIS — R739 Hyperglycemia, unspecified: Secondary | ICD-10-CM | POA: Diagnosis not present

## 2022-11-19 DIAGNOSIS — M159 Polyosteoarthritis, unspecified: Secondary | ICD-10-CM | POA: Diagnosis not present

## 2022-11-22 DIAGNOSIS — M6281 Muscle weakness (generalized): Secondary | ICD-10-CM | POA: Diagnosis not present

## 2022-11-22 DIAGNOSIS — R2681 Unsteadiness on feet: Secondary | ICD-10-CM | POA: Diagnosis not present

## 2022-11-22 DIAGNOSIS — R2689 Other abnormalities of gait and mobility: Secondary | ICD-10-CM | POA: Diagnosis not present

## 2022-11-26 DIAGNOSIS — R2689 Other abnormalities of gait and mobility: Secondary | ICD-10-CM | POA: Diagnosis not present

## 2022-11-26 DIAGNOSIS — R2681 Unsteadiness on feet: Secondary | ICD-10-CM | POA: Diagnosis not present

## 2022-11-26 DIAGNOSIS — M6281 Muscle weakness (generalized): Secondary | ICD-10-CM | POA: Diagnosis not present

## 2022-11-26 DIAGNOSIS — R739 Hyperglycemia, unspecified: Secondary | ICD-10-CM | POA: Diagnosis not present

## 2022-11-26 DIAGNOSIS — I10 Essential (primary) hypertension: Secondary | ICD-10-CM | POA: Diagnosis not present

## 2022-11-26 NOTE — Telephone Encounter (Signed)
[facility] said that the patient needs a large bore MRI, which they don't have so they told me to send the MRI order to [facility] in [place].  [address] [place],  [id number] [phone]  Craig Staggers: [phone] exp. 11/26/22-12/26/22

## 2022-11-27 DIAGNOSIS — M6281 Muscle weakness (generalized): Secondary | ICD-10-CM | POA: Diagnosis not present

## 2022-11-27 DIAGNOSIS — R2681 Unsteadiness on feet: Secondary | ICD-10-CM | POA: Diagnosis not present

## 2022-11-27 DIAGNOSIS — R2689 Other abnormalities of gait and mobility: Secondary | ICD-10-CM | POA: Diagnosis not present

## 2022-11-28 DIAGNOSIS — M6281 Muscle weakness (generalized): Secondary | ICD-10-CM | POA: Diagnosis not present

## 2022-11-28 DIAGNOSIS — R2681 Unsteadiness on feet: Secondary | ICD-10-CM | POA: Diagnosis not present

## 2022-11-28 DIAGNOSIS — R2689 Other abnormalities of gait and mobility: Secondary | ICD-10-CM | POA: Diagnosis not present

## 2022-11-28 DIAGNOSIS — F339 Major depressive disorder, recurrent, unspecified: Secondary | ICD-10-CM | POA: Diagnosis not present

## 2022-11-29 DIAGNOSIS — R2681 Unsteadiness on feet: Secondary | ICD-10-CM | POA: Diagnosis not present

## 2022-11-29 DIAGNOSIS — M6281 Muscle weakness (generalized): Secondary | ICD-10-CM | POA: Diagnosis not present

## 2022-11-29 DIAGNOSIS — R2689 Other abnormalities of gait and mobility: Secondary | ICD-10-CM | POA: Diagnosis not present

## 2022-12-02 DIAGNOSIS — M6281 Muscle weakness (generalized): Secondary | ICD-10-CM | POA: Diagnosis not present

## 2022-12-02 DIAGNOSIS — R2689 Other abnormalities of gait and mobility: Secondary | ICD-10-CM | POA: Diagnosis not present

## 2022-12-02 DIAGNOSIS — R2681 Unsteadiness on feet: Secondary | ICD-10-CM | POA: Diagnosis not present

## 2022-12-03 DIAGNOSIS — M6281 Muscle weakness (generalized): Secondary | ICD-10-CM | POA: Diagnosis not present

## 2022-12-03 DIAGNOSIS — R2689 Other abnormalities of gait and mobility: Secondary | ICD-10-CM | POA: Diagnosis not present

## 2022-12-03 DIAGNOSIS — R2681 Unsteadiness on feet: Secondary | ICD-10-CM | POA: Diagnosis not present

## 2022-12-05 DIAGNOSIS — M6281 Muscle weakness (generalized): Secondary | ICD-10-CM | POA: Diagnosis not present

## 2022-12-05 DIAGNOSIS — R2681 Unsteadiness on feet: Secondary | ICD-10-CM | POA: Diagnosis not present

## 2022-12-06 DIAGNOSIS — M6281 Muscle weakness (generalized): Secondary | ICD-10-CM | POA: Diagnosis not present

## 2022-12-06 DIAGNOSIS — R2681 Unsteadiness on feet: Secondary | ICD-10-CM | POA: Diagnosis not present

## 2022-12-09 DIAGNOSIS — M6281 Muscle weakness (generalized): Secondary | ICD-10-CM | POA: Diagnosis not present

## 2022-12-09 DIAGNOSIS — R2689 Other abnormalities of gait and mobility: Secondary | ICD-10-CM | POA: Diagnosis not present

## 2022-12-09 DIAGNOSIS — R2681 Unsteadiness on feet: Secondary | ICD-10-CM | POA: Diagnosis not present

## 2022-12-10 DIAGNOSIS — M6281 Muscle weakness (generalized): Secondary | ICD-10-CM | POA: Diagnosis not present

## 2022-12-10 DIAGNOSIS — R2689 Other abnormalities of gait and mobility: Secondary | ICD-10-CM | POA: Diagnosis not present

## 2022-12-10 DIAGNOSIS — R2681 Unsteadiness on feet: Secondary | ICD-10-CM | POA: Diagnosis not present

## 2022-12-12 ENCOUNTER — Other Ambulatory Visit: Payer: Self-pay | Admitting: Neurology

## 2022-12-12 DIAGNOSIS — M6281 Muscle weakness (generalized): Secondary | ICD-10-CM | POA: Diagnosis not present

## 2022-12-12 DIAGNOSIS — R2681 Unsteadiness on feet: Secondary | ICD-10-CM | POA: Diagnosis not present

## 2022-12-12 DIAGNOSIS — F339 Major depressive disorder, recurrent, unspecified: Secondary | ICD-10-CM | POA: Diagnosis not present

## 2022-12-12 DIAGNOSIS — R2689 Other abnormalities of gait and mobility: Secondary | ICD-10-CM | POA: Diagnosis not present

## 2022-12-12 MED ORDER — ALPRAZOLAM 0.5 MG PO TABS: ORAL_TABLET | ORAL | 0 refills | Status: DC

## 2022-12-13 DIAGNOSIS — R2689 Other abnormalities of gait and mobility: Secondary | ICD-10-CM | POA: Diagnosis not present

## 2022-12-13 DIAGNOSIS — M6281 Muscle weakness (generalized): Secondary | ICD-10-CM | POA: Diagnosis not present

## 2022-12-13 DIAGNOSIS — R2681 Unsteadiness on feet: Secondary | ICD-10-CM | POA: Diagnosis not present

## 2022-12-16 DIAGNOSIS — R2681 Unsteadiness on feet: Secondary | ICD-10-CM | POA: Diagnosis not present

## 2022-12-16 DIAGNOSIS — M6281 Muscle weakness (generalized): Secondary | ICD-10-CM | POA: Diagnosis not present

## 2022-12-16 DIAGNOSIS — R2689 Other abnormalities of gait and mobility: Secondary | ICD-10-CM | POA: Diagnosis not present

## 2022-12-17 DIAGNOSIS — R2681 Unsteadiness on feet: Secondary | ICD-10-CM | POA: Diagnosis not present

## 2022-12-17 DIAGNOSIS — M6281 Muscle weakness (generalized): Secondary | ICD-10-CM | POA: Diagnosis not present

## 2022-12-17 DIAGNOSIS — R2689 Other abnormalities of gait and mobility: Secondary | ICD-10-CM | POA: Diagnosis not present

## 2022-12-19 ENCOUNTER — Other Ambulatory Visit: Payer: Self-pay | Admitting: Neurology

## 2022-12-19 DIAGNOSIS — R2689 Other abnormalities of gait and mobility: Secondary | ICD-10-CM | POA: Diagnosis not present

## 2022-12-19 DIAGNOSIS — R2681 Unsteadiness on feet: Secondary | ICD-10-CM | POA: Diagnosis not present

## 2022-12-19 DIAGNOSIS — M6281 Muscle weakness (generalized): Secondary | ICD-10-CM | POA: Diagnosis not present

## 2022-12-19 MED ORDER — ALPRAZOLAM 0.5 MG PO TABS: ORAL_TABLET | ORAL | 0 refills | Status: AC

## 2022-12-20 DIAGNOSIS — R2689 Other abnormalities of gait and mobility: Secondary | ICD-10-CM | POA: Diagnosis not present

## 2022-12-20 DIAGNOSIS — M6281 Muscle weakness (generalized): Secondary | ICD-10-CM | POA: Diagnosis not present

## 2022-12-20 DIAGNOSIS — R2681 Unsteadiness on feet: Secondary | ICD-10-CM | POA: Diagnosis not present

## 2022-12-23 DIAGNOSIS — M6281 Muscle weakness (generalized): Secondary | ICD-10-CM | POA: Diagnosis not present

## 2022-12-23 DIAGNOSIS — R2681 Unsteadiness on feet: Secondary | ICD-10-CM | POA: Diagnosis not present

## 2022-12-23 DIAGNOSIS — R2689 Other abnormalities of gait and mobility: Secondary | ICD-10-CM | POA: Diagnosis not present

## 2022-12-24 DIAGNOSIS — G309 Alzheimer's disease, unspecified: Secondary | ICD-10-CM | POA: Diagnosis not present

## 2022-12-27 DIAGNOSIS — M6281 Muscle weakness (generalized): Secondary | ICD-10-CM | POA: Diagnosis not present

## 2022-12-27 DIAGNOSIS — R2681 Unsteadiness on feet: Secondary | ICD-10-CM | POA: Diagnosis not present

## 2022-12-27 DIAGNOSIS — R2689 Other abnormalities of gait and mobility: Secondary | ICD-10-CM | POA: Diagnosis not present

## 2022-12-30 NOTE — Telephone Encounter (Signed)
Dr.Sater the Mri results have posted in care everywhere for [facility].

## 2023-01-01 DIAGNOSIS — M6281 Muscle weakness (generalized): Secondary | ICD-10-CM | POA: Diagnosis not present

## 2023-01-01 DIAGNOSIS — R2681 Unsteadiness on feet: Secondary | ICD-10-CM | POA: Diagnosis not present

## 2023-01-02 DIAGNOSIS — F339 Major depressive disorder, recurrent, unspecified: Secondary | ICD-10-CM | POA: Diagnosis not present

## 2023-01-07 DIAGNOSIS — M6281 Muscle weakness (generalized): Secondary | ICD-10-CM | POA: Diagnosis not present

## 2023-01-07 DIAGNOSIS — R2689 Other abnormalities of gait and mobility: Secondary | ICD-10-CM | POA: Diagnosis not present

## 2023-01-07 DIAGNOSIS — R2681 Unsteadiness on feet: Secondary | ICD-10-CM | POA: Diagnosis not present

## 2023-01-10 DIAGNOSIS — R2689 Other abnormalities of gait and mobility: Secondary | ICD-10-CM | POA: Diagnosis not present

## 2023-01-10 DIAGNOSIS — M6281 Muscle weakness (generalized): Secondary | ICD-10-CM | POA: Diagnosis not present

## 2023-01-10 DIAGNOSIS — R2681 Unsteadiness on feet: Secondary | ICD-10-CM | POA: Diagnosis not present

## 2023-01-16 DIAGNOSIS — F339 Major depressive disorder, recurrent, unspecified: Secondary | ICD-10-CM | POA: Diagnosis not present

## 2023-01-24 ENCOUNTER — Other Ambulatory Visit: Payer: Self-pay | Admitting: Neurology

## 2023-01-27 NOTE — Telephone Encounter (Signed)
ROUTING TO PROVIDER:  I went to refill this and received a hard stop stating pt intolerant of statins:  Very High Allergy/Contraindication: lovastatinNo reactions specified. No reaction type specified. User documented allergy severity: None specified. Drug Class Match with STATINS. "Muscle soreness and loss of memory" Details

## 2023-01-30 DIAGNOSIS — M79675 Pain in left toe(s): Secondary | ICD-10-CM | POA: Diagnosis not present

## 2023-01-30 DIAGNOSIS — L84 Corns and callosities: Secondary | ICD-10-CM | POA: Diagnosis not present

## 2023-01-30 DIAGNOSIS — M79672 Pain in left foot: Secondary | ICD-10-CM | POA: Diagnosis not present

## 2023-01-30 DIAGNOSIS — B351 Tinea unguium: Secondary | ICD-10-CM | POA: Diagnosis not present

## 2023-01-30 DIAGNOSIS — F339 Major depressive disorder, recurrent, unspecified: Secondary | ICD-10-CM | POA: Diagnosis not present

## 2023-01-30 DIAGNOSIS — M79671 Pain in right foot: Secondary | ICD-10-CM | POA: Diagnosis not present

## 2023-01-30 DIAGNOSIS — I739 Peripheral vascular disease, unspecified: Secondary | ICD-10-CM | POA: Diagnosis not present

## 2023-01-30 DIAGNOSIS — M79674 Pain in right toe(s): Secondary | ICD-10-CM | POA: Diagnosis not present

## 2023-02-04 DIAGNOSIS — Z0000 Encounter for general adult medical examination without abnormal findings: Secondary | ICD-10-CM | POA: Diagnosis not present

## 2023-02-04 DIAGNOSIS — F0390 Unspecified dementia without behavioral disturbance: Secondary | ICD-10-CM | POA: Diagnosis not present

## 2023-02-04 DIAGNOSIS — E785 Hyperlipidemia, unspecified: Secondary | ICD-10-CM | POA: Diagnosis not present

## 2023-02-04 DIAGNOSIS — M159 Polyosteoarthritis, unspecified: Secondary | ICD-10-CM | POA: Diagnosis not present

## 2023-02-04 DIAGNOSIS — I10 Essential (primary) hypertension: Secondary | ICD-10-CM | POA: Diagnosis not present

## 2023-02-05 DIAGNOSIS — F339 Major depressive disorder, recurrent, unspecified: Secondary | ICD-10-CM | POA: Diagnosis not present

## 2023-02-10 ENCOUNTER — Ambulatory Visit: Payer: Medicare PPO | Admitting: Neurology

## 2023-02-10 DIAGNOSIS — M6281 Muscle weakness (generalized): Secondary | ICD-10-CM | POA: Diagnosis not present

## 2023-02-10 DIAGNOSIS — R2681 Unsteadiness on feet: Secondary | ICD-10-CM | POA: Diagnosis not present

## 2023-02-13 DIAGNOSIS — F339 Major depressive disorder, recurrent, unspecified: Secondary | ICD-10-CM | POA: Diagnosis not present

## 2023-02-14 DIAGNOSIS — R2681 Unsteadiness on feet: Secondary | ICD-10-CM | POA: Diagnosis not present

## 2023-02-14 DIAGNOSIS — M6281 Muscle weakness (generalized): Secondary | ICD-10-CM | POA: Diagnosis not present

## 2023-02-21 DIAGNOSIS — M6281 Muscle weakness (generalized): Secondary | ICD-10-CM | POA: Diagnosis not present

## 2023-02-21 DIAGNOSIS — R2681 Unsteadiness on feet: Secondary | ICD-10-CM | POA: Diagnosis not present

## 2023-02-24 DIAGNOSIS — F339 Major depressive disorder, recurrent, unspecified: Secondary | ICD-10-CM | POA: Diagnosis not present

## 2023-02-25 DIAGNOSIS — M6281 Muscle weakness (generalized): Secondary | ICD-10-CM | POA: Diagnosis not present

## 2023-02-25 DIAGNOSIS — R2681 Unsteadiness on feet: Secondary | ICD-10-CM | POA: Diagnosis not present

## 2023-03-04 DIAGNOSIS — R2681 Unsteadiness on feet: Secondary | ICD-10-CM | POA: Diagnosis not present

## 2023-03-04 DIAGNOSIS — M6281 Muscle weakness (generalized): Secondary | ICD-10-CM | POA: Diagnosis not present

## 2023-03-20 DIAGNOSIS — F339 Major depressive disorder, recurrent, unspecified: Secondary | ICD-10-CM | POA: Diagnosis not present

## 2023-04-03 DIAGNOSIS — F339 Major depressive disorder, recurrent, unspecified: Secondary | ICD-10-CM | POA: Diagnosis not present
